# Patient Record
Sex: Male | Born: 1955 | Race: White | Hispanic: No | Marital: Married
Health system: Midwestern US, Academic
[De-identification: ages and names within clinical notes are randomized; demographics above are authoritative.]

---

## 2015-04-01 IMAGING — CR LOW_EXM
3 series · 3 of 3 positions shown · non-contrast
Comparison: none

EXAM: RIGHT KNEE

IMPRESSIONS:
Unremarkable right knee series.
REASON FOR EXAM: Knee pain.  Knee locking up.
TECHNIQUE: Three views right knee obtained 04/01/2015.

[knee ap]
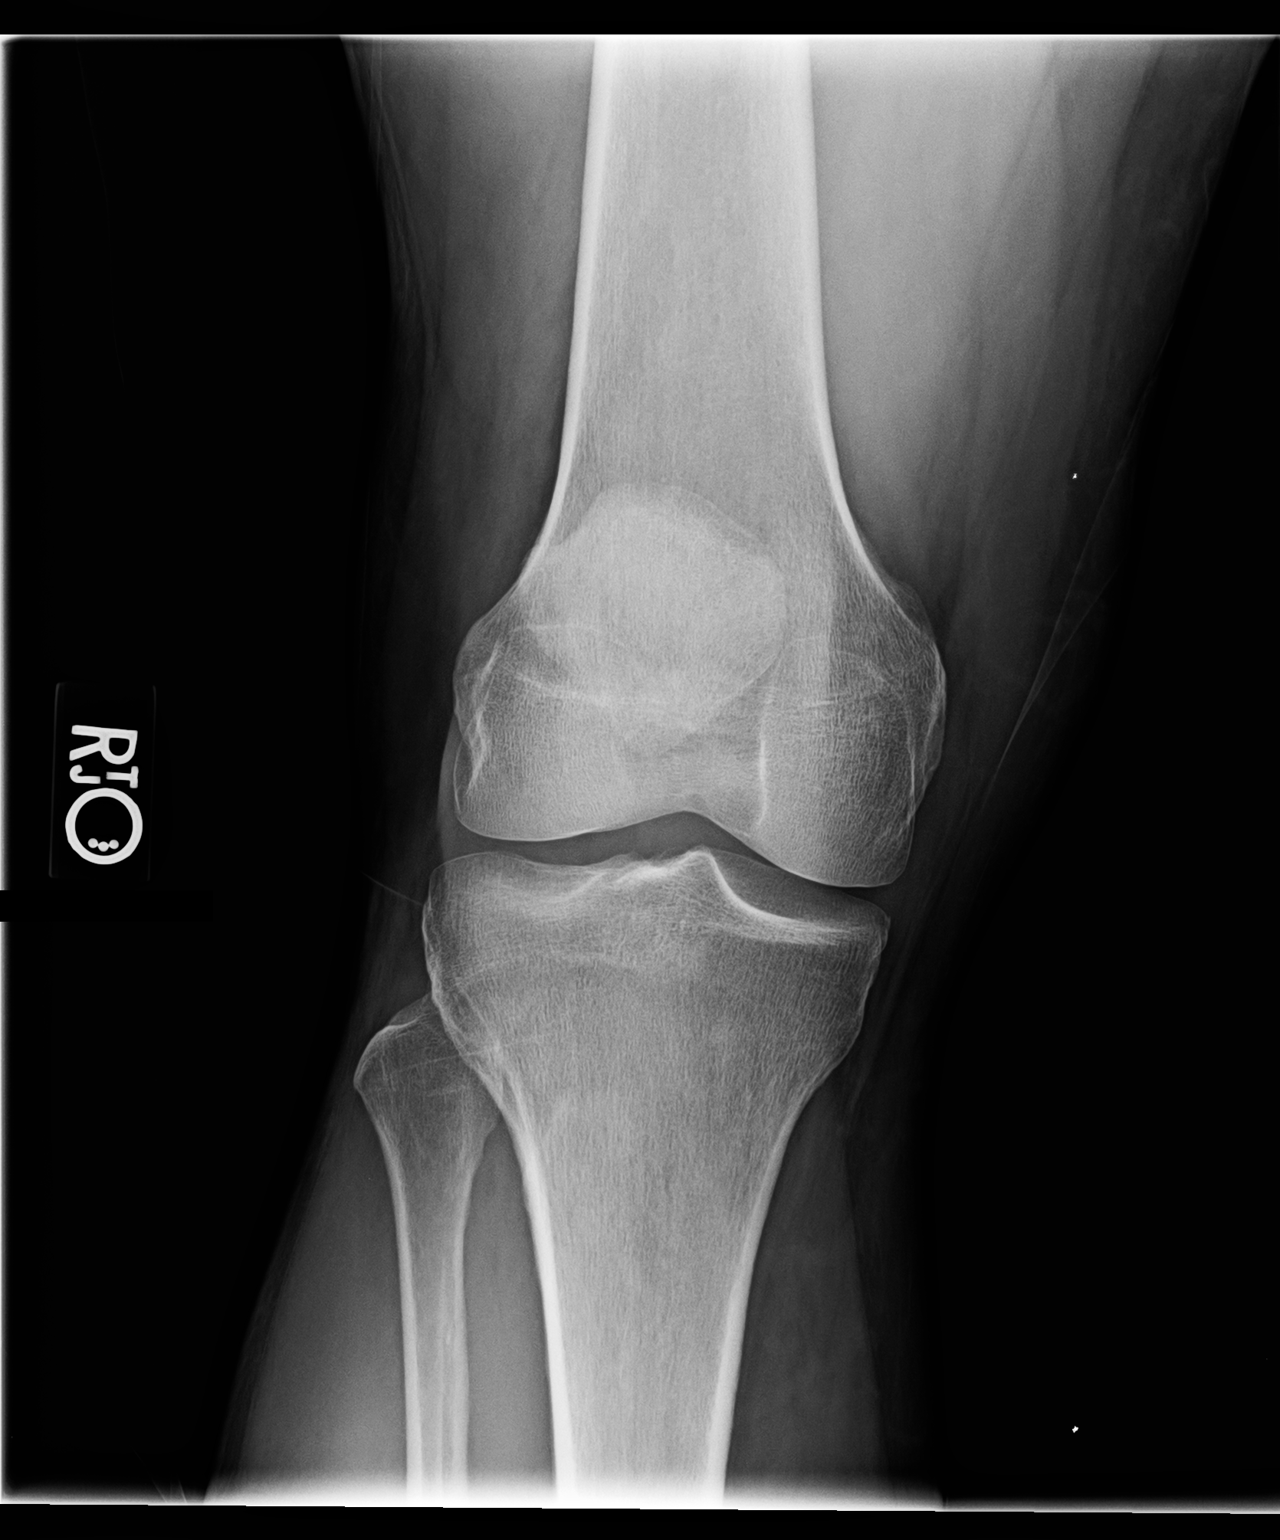

[knee lat]
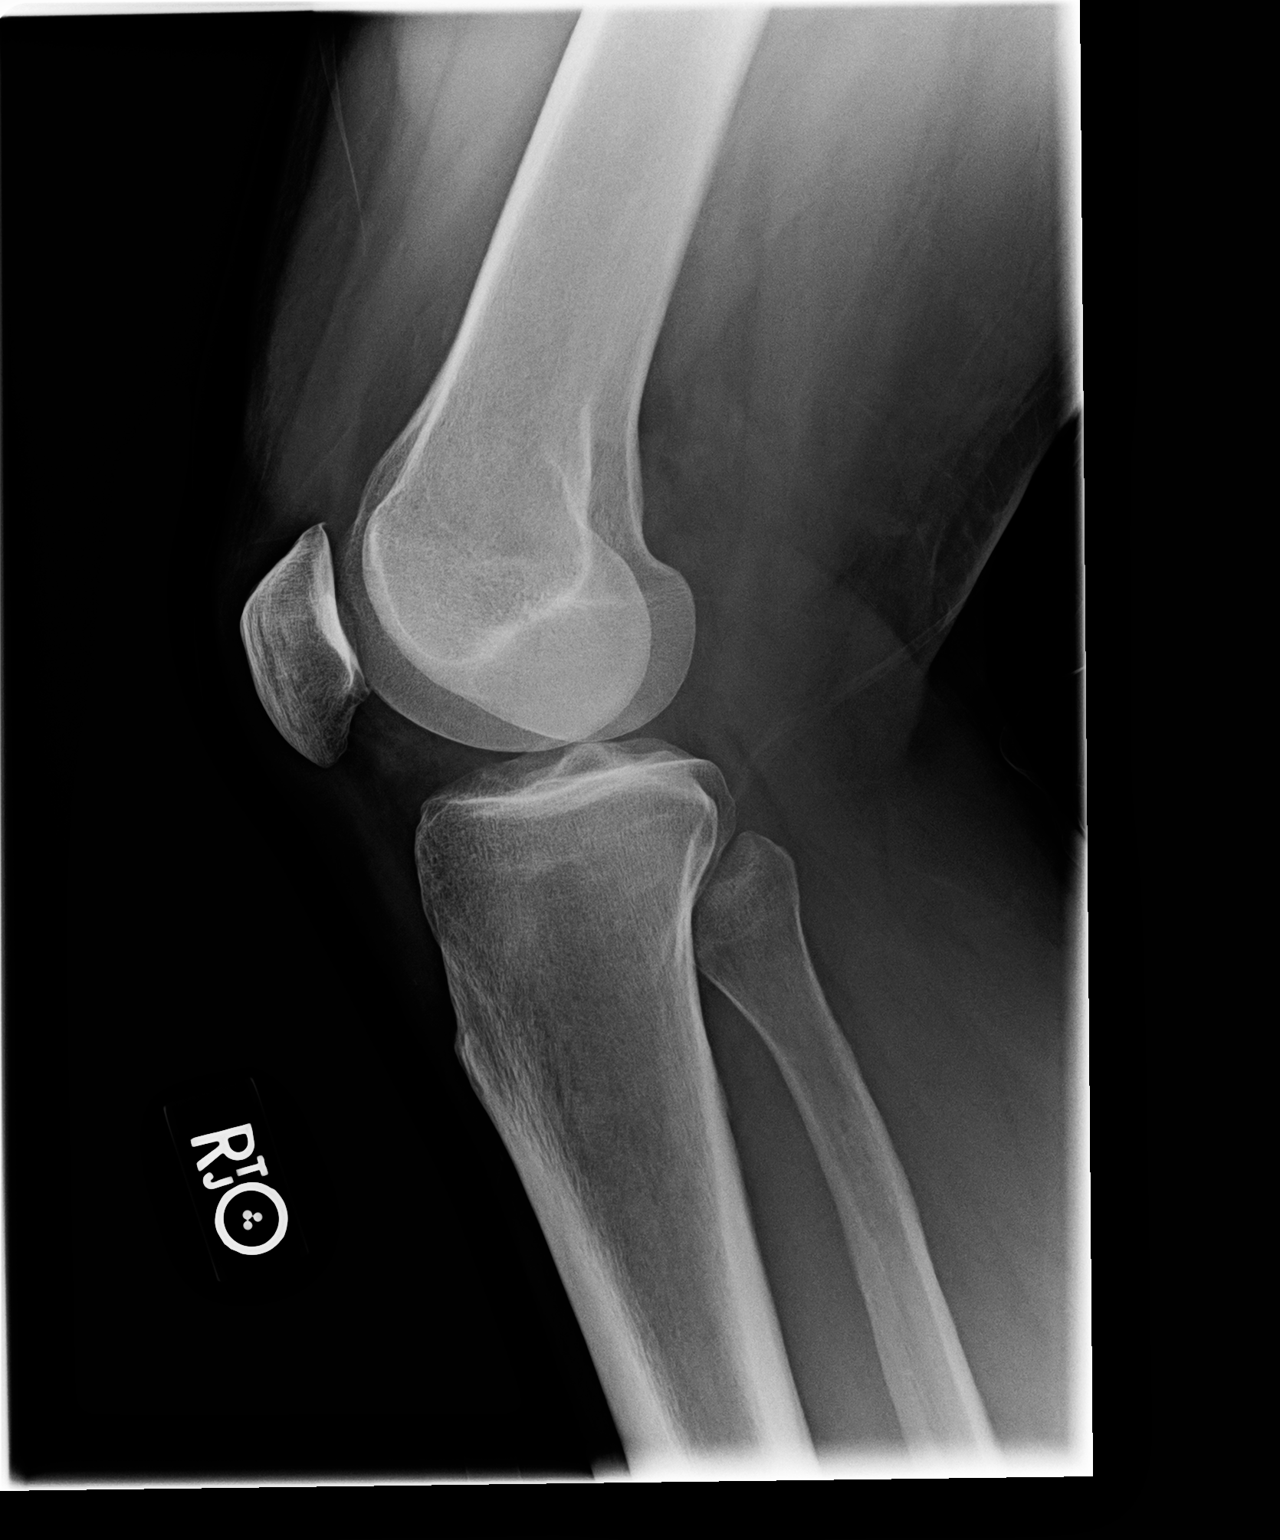

[knee sunrise]
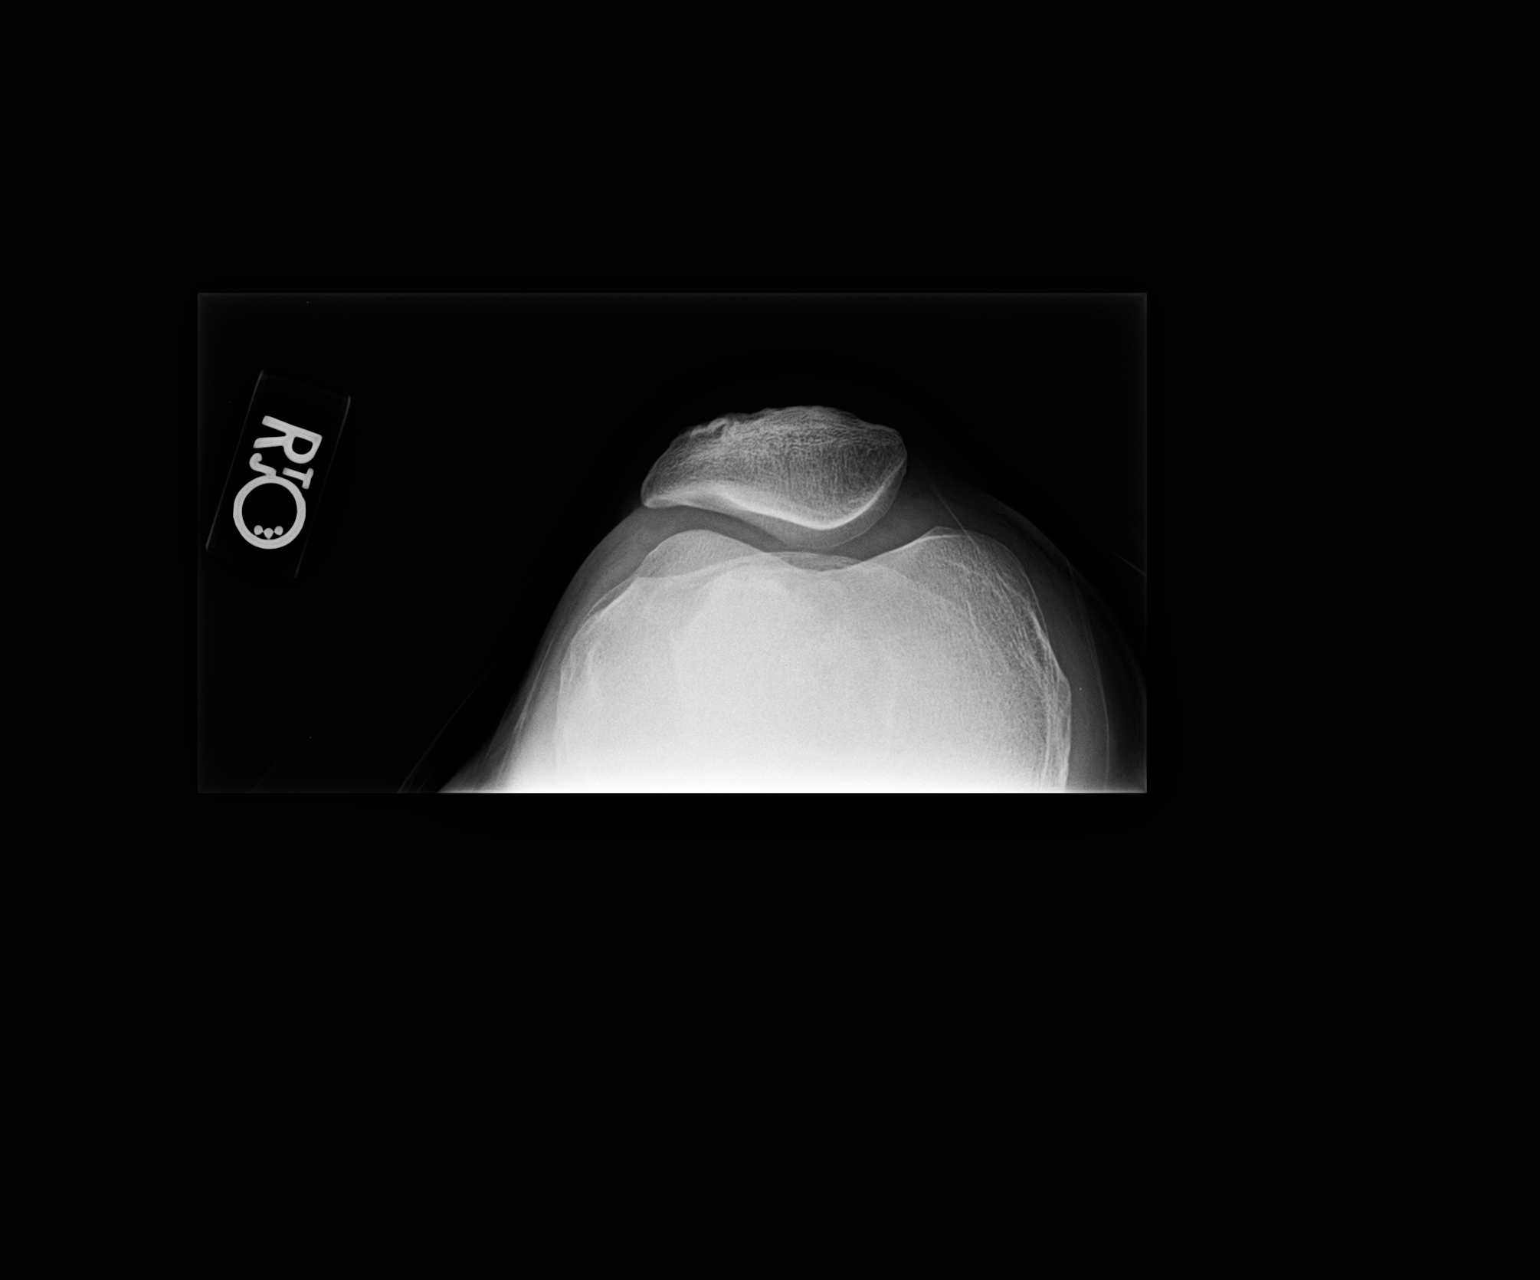

[3 of 3 positions shown; findings below may reference images not displayed]

FINDINGS: Osseous structures are intact.  No fracture.  No dislocation.  No
lytic or blastic change.  Tibial plateaus and femoral condyles are
maintained.  Patella is located within the patella groove.

Tech Notes: RT KNEE LOCKING. C/O ANTERIOR KNEE PAIN AND HIP PAIN. TJ/HB

## 2020-01-25 IMAGING — CR CHEST
4 series · 4 of 4 positions shown · non-contrast
Comparison: none

[shoulder external]
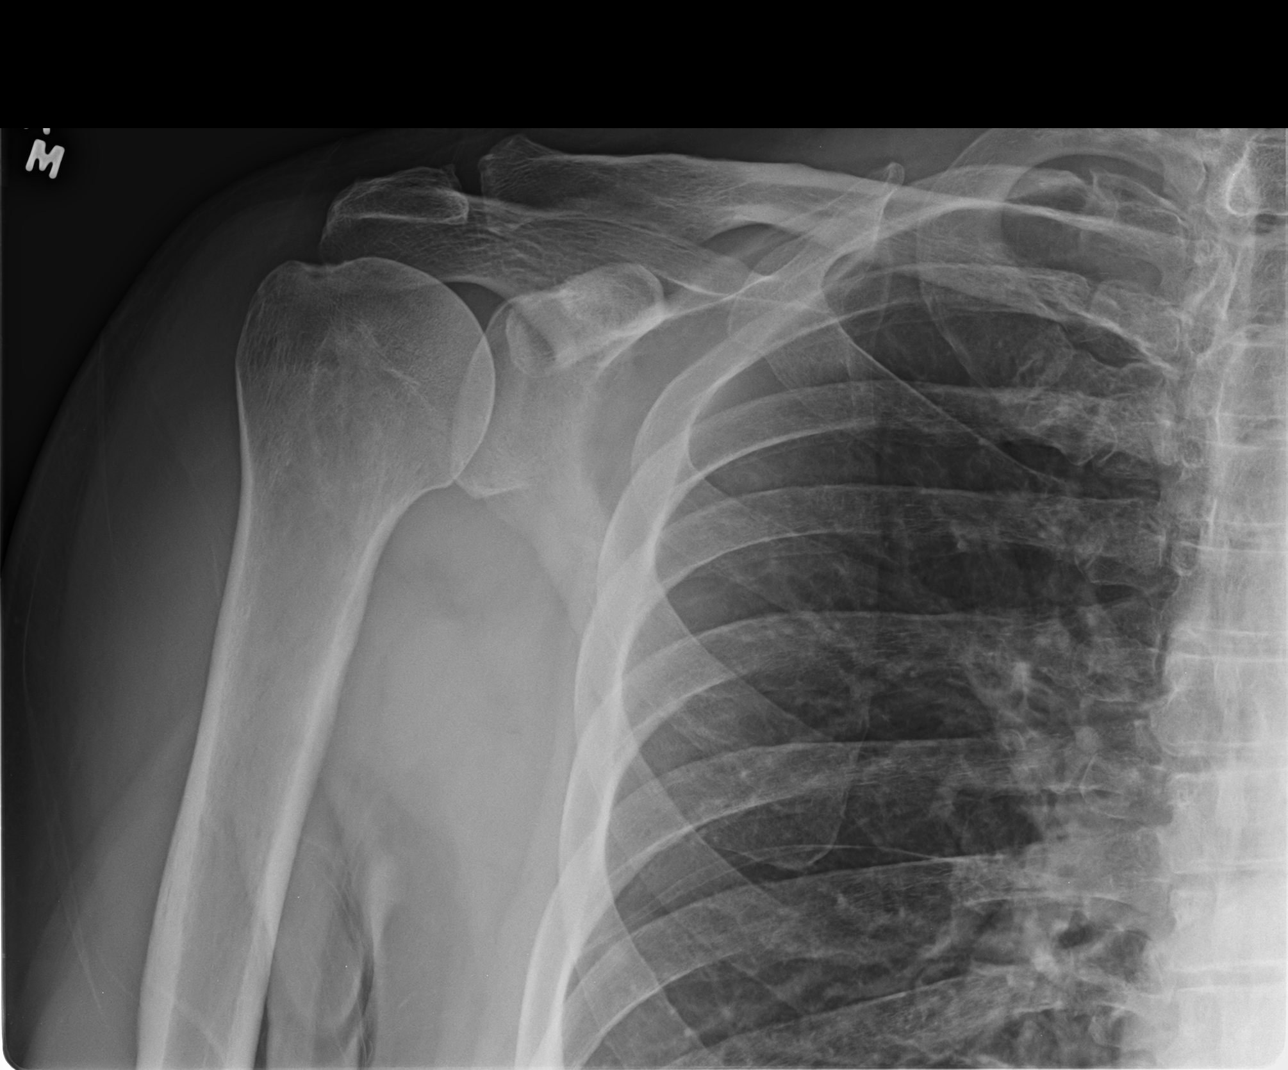

[shoulder internal]
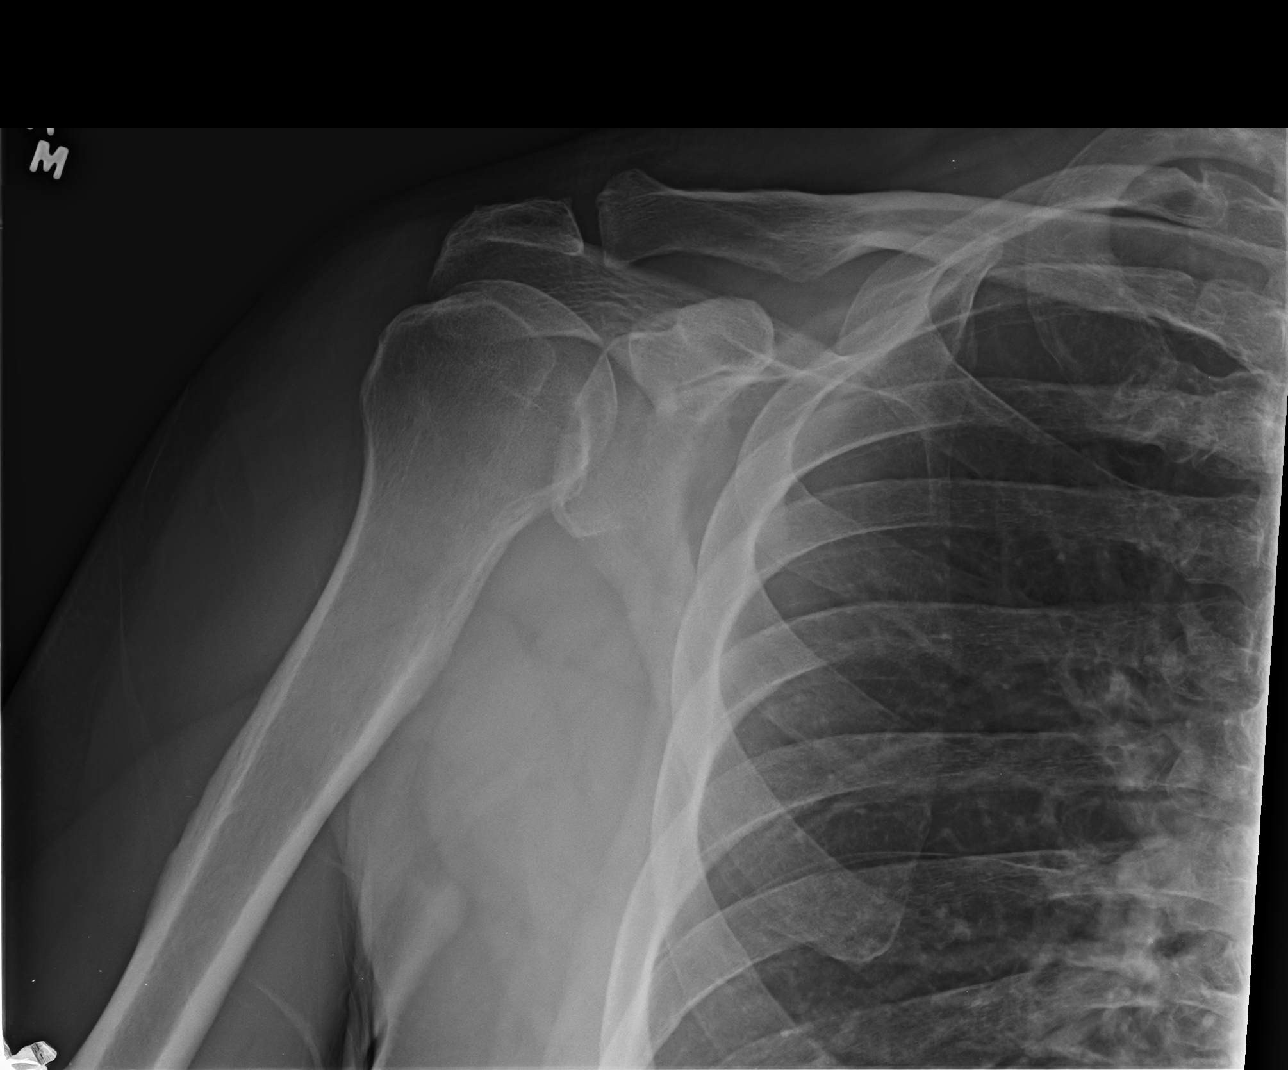

[shoulder y-view]
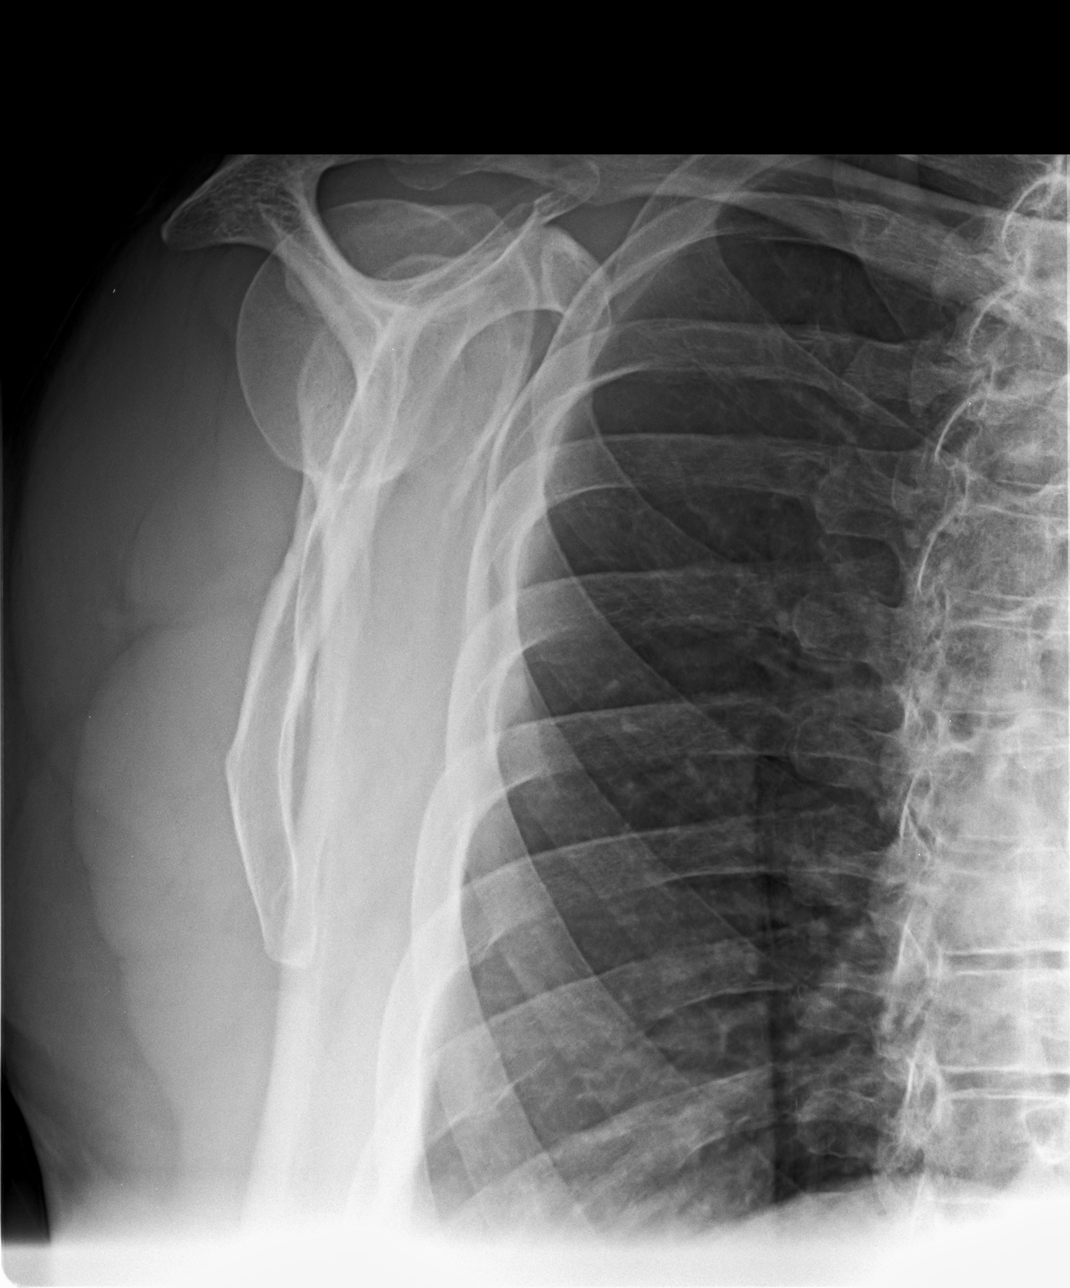

[shoulder axillary]
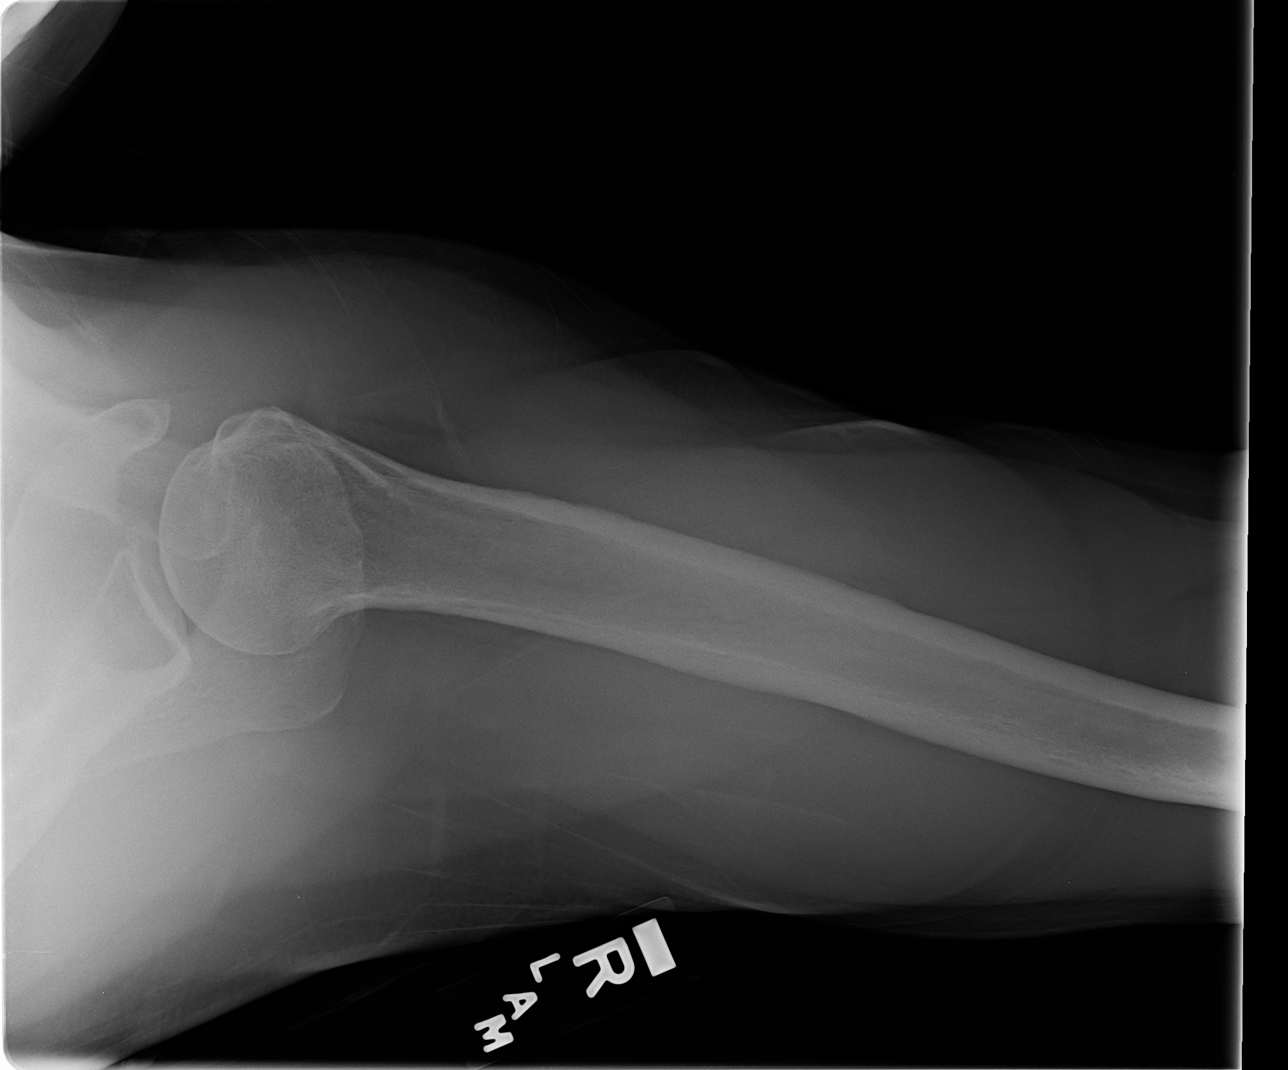

[4 of 4 positions shown; findings below may reference images not displayed]

DIAGNOSTIC STUDIES

EXAM

XR shoulder right, complete

INDICATION

shoulder pain
RT SHOULDER PAIN. LM

TECHNIQUE

Four views of the right shoulder

COMPARISONS

None

FINDINGS

No acute fracture or dislocation. No significant degenerative changes are noted. The soft tissues
are within normal limits. Normal marrow mineralization.

IMPRESSION

No acute osseous findings.

Tech Notes:

RT SHOULDER PAIN. LM

## 2020-01-30 IMAGING — MR Shoulder^ROUTINE
4 of 5 series · 25 of 40 positions shown · non-contrast
Comparison: none

[Series 4: T2 fat-sat · axial · 4.0mm · 0.35mm/px · z∈[-44,+60]mm · 8 of 23 slices shown (1 of 2)]
[im 1/23]
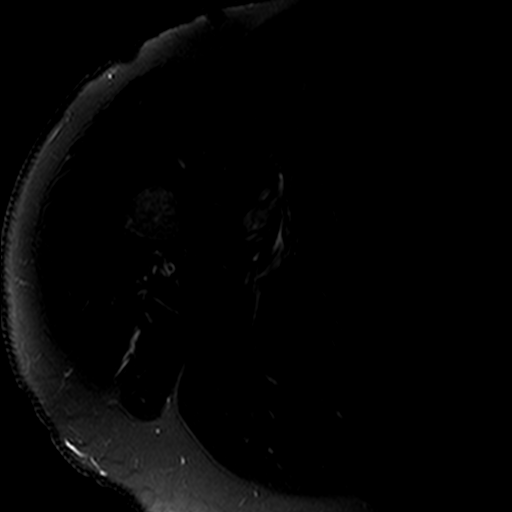
[im 3/23]
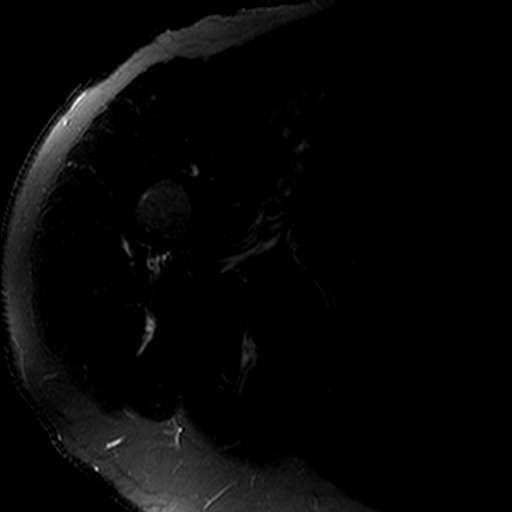
[im 8/23]
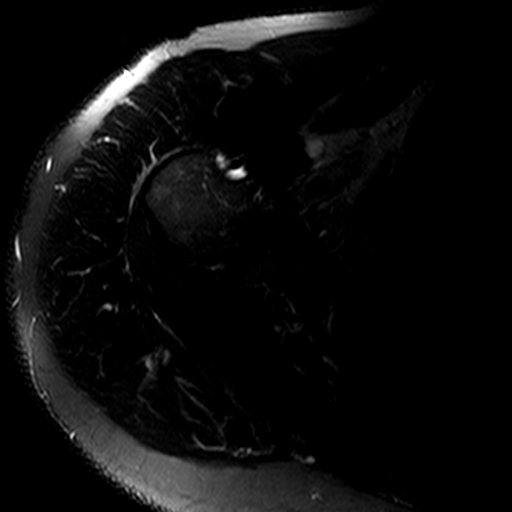
[im 10/23]
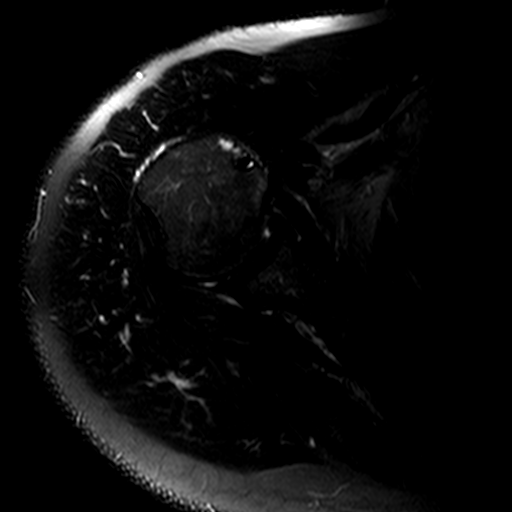
[im 13/23]
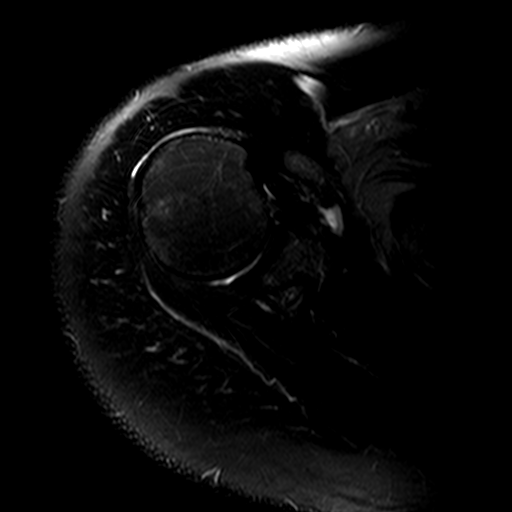
[im 15/23]
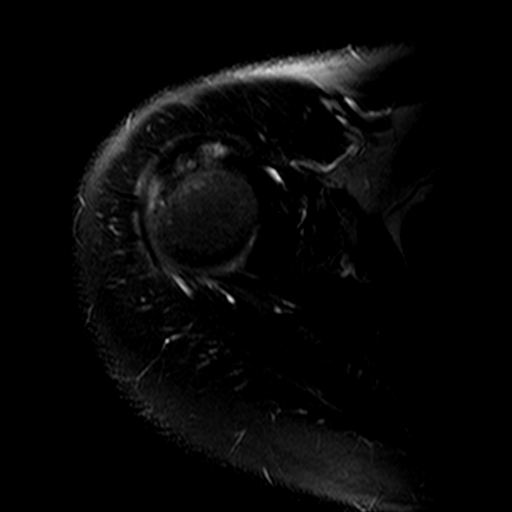
[im 20/23]
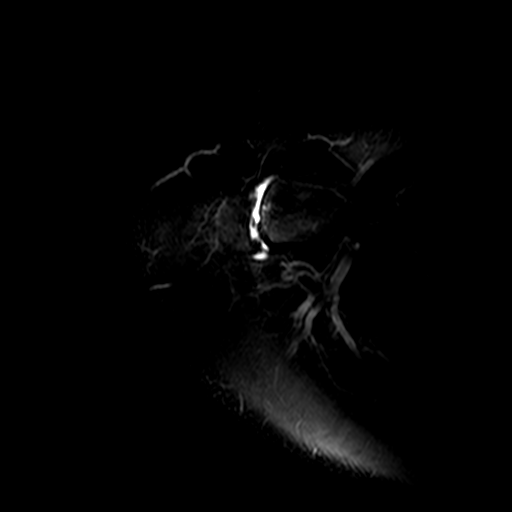
[im 23/23]
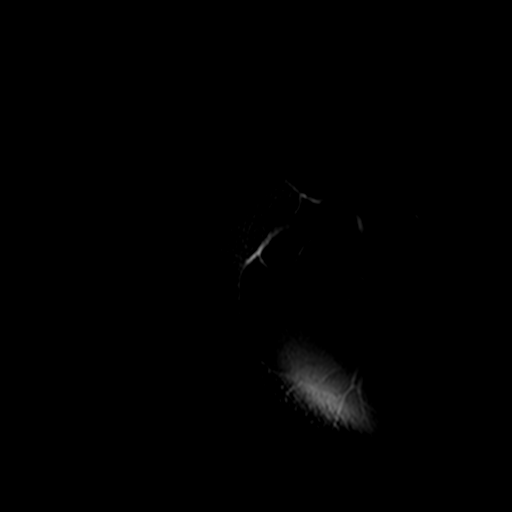

[Series 5: T2 fat-sat · coronal · 4.0mm · 0.27mm/px · 7 of 20 slices shown (2 of 2)]
[im 1/20]
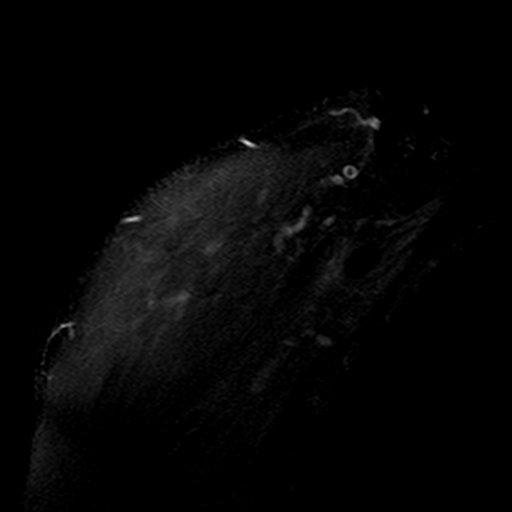
[im 4/20]
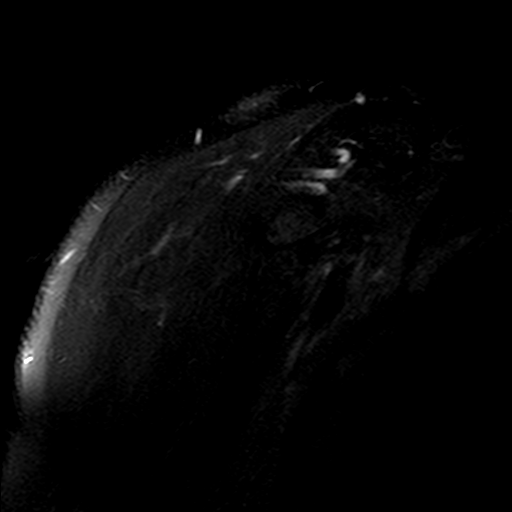
[im 7/20]
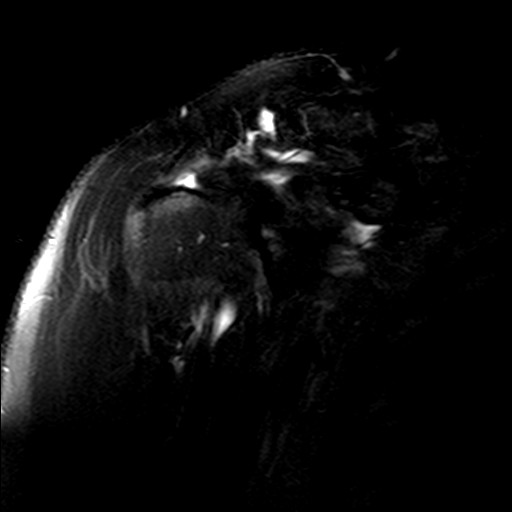
[im 10/20]
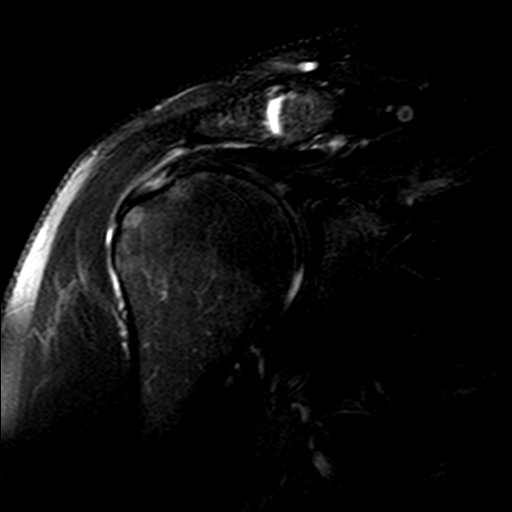
[im 13/20]
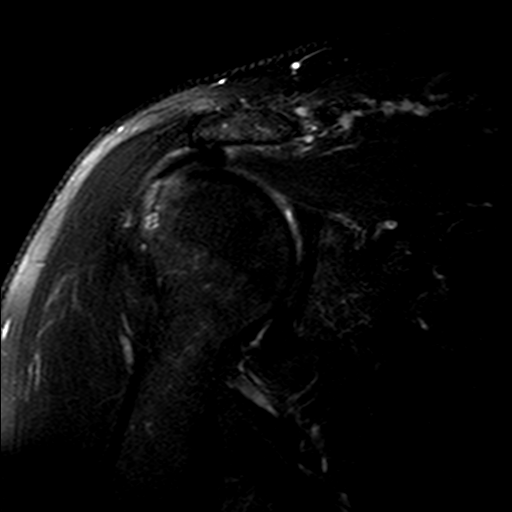
[im 16/20]
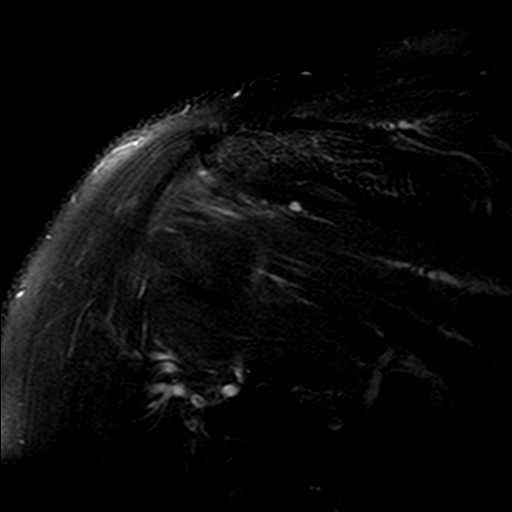
[im 20/20]
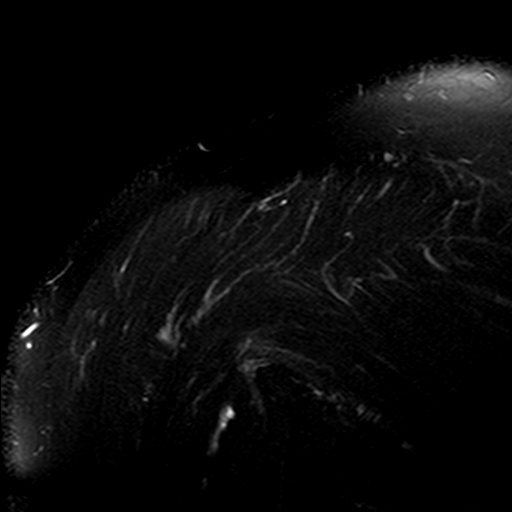

[Series 6: T1 · oblique · 4.0mm · 0.44mm/px · 7 of 22 slices shown (1 of 2)]
[im 1/22]
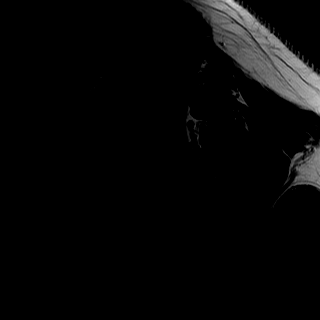
[im 4/22]
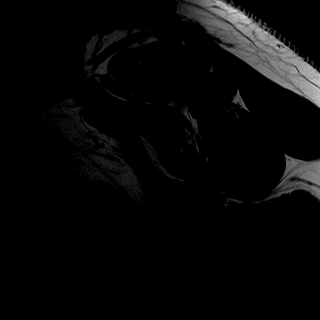
[im 7/22]
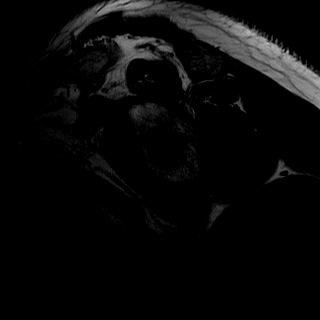
[im 10/22]
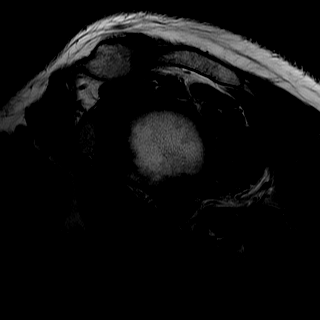
[im 13/22]
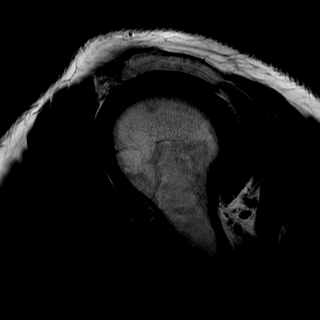
[im 16/22]
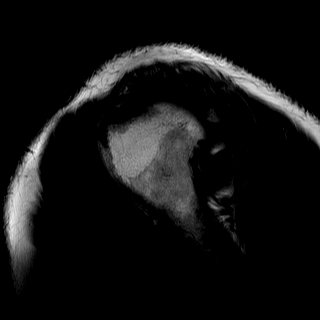
[im 19/22]
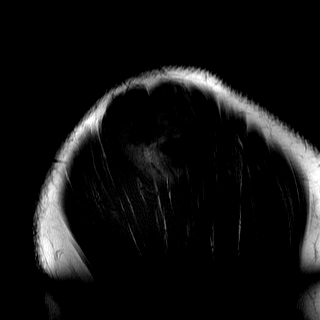

[Series 8: T1 · coronal · 4.0mm · 0.44mm/px · 3 of 20 slices shown (2 of 2)]
[im 4/20]
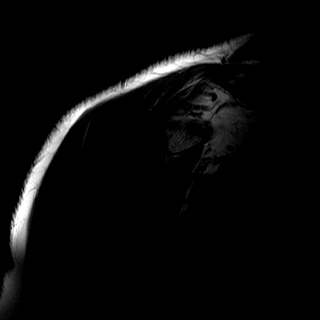
[im 10/20]
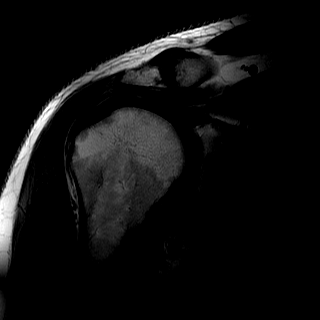
[im 16/20]
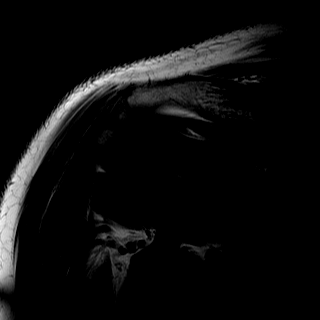

[25 of 40 positions shown; findings below may reference images not displayed]

DIAGNOSTIC STUDIES

EXAM

MRI of the right shoulder without contrast.

INDICATION

shoulder pain
right shoulder pain x1 year, no prev sx on shoulder

TECHNIQUE

Oblique coronal, oblique sagittal, and axial images were obtained with variable T1 and T2
weighting.

COMPARISONS

None available

FINDINGS

Diffuse T2 signal is seen throughout the rotator cuff tendon consistent with
tendinosis/tendinopathy and/or associated intra substance type tear. Small rim rent tear is seen at
the anterior insertion the tendon on the greater tuberosity.

The sub scapularis and bicipital tendons are within normal limits.

Prominent hypertrophic degenerative changes of the AC joint are seen. This slightly deforms the
underlying supraspinatus. The labrum is unremarkable.

The coracoid humeral distance is diminished.

IMPRESSION

Tendinosis/tendinopathy of the rotator cuff tendon with small rim rent tear at the anterior
insertion of the supraspinatus.

Prominent hypertrophic degenerative changes of the AC joint. Symptoms of impingement should be
excluded clinically.

Narrowed coracoid humeral distance. Symptoms of coracoid impingement should be excluded clinically
as well.

Tech Notes:

right shoulder pain x1 year, no prev sx on shoulder

## 2020-05-08 ENCOUNTER — Encounter: Admit: 2020-05-08 | Discharge: 2020-05-08 | Payer: BC Managed Care – PPO

## 2020-05-08 ENCOUNTER — Ambulatory Visit: Admit: 2020-05-08 | Discharge: 2020-05-08 | Payer: BC Managed Care – PPO

## 2020-05-08 DIAGNOSIS — I10 Essential (primary) hypertension: Secondary | ICD-10-CM

## 2020-05-08 DIAGNOSIS — J302 Other seasonal allergic rhinitis: Secondary | ICD-10-CM

## 2020-05-08 DIAGNOSIS — I639 Cerebral infarction, unspecified: Secondary | ICD-10-CM

## 2020-05-08 DIAGNOSIS — H9042 Sensorineural hearing loss, unilateral, left ear, with unrestricted hearing on the contralateral side: Secondary | ICD-10-CM

## 2020-05-08 DIAGNOSIS — H938X2 Other specified disorders of left ear: Secondary | ICD-10-CM

## 2020-05-08 MED ORDER — PREDNISONE 50 MG PO TAB
50 mg | ORAL_TABLET | Freq: Every day | ORAL | 0 refills | Status: AC
Start: 2020-05-08 — End: ?

## 2020-05-08 NOTE — Progress Notes
Date of Service: 05/08/2020    Subjective:             Zebediah Beezley is a 64 y.o. male.    History of Present Illness  Demitri returns after about a 3 to 4-year absence with a history of left ear fullness.  Recall that a tube was placed for this previously which made his symptoms worse.  I did remove it and paper patch it.  He was doing fine for a few years.  In July, he went swimming and diving into a lake and his symptoms of ear fullness on the left side is flared back up again.  It now radiates down to his neck and will occasionally build up to the point where it causes him some occipital headaches.  Today does not bother him so much.  No drainage from the ears noted.  He was given some treatment by his primary care physician for this in the form of eardrops.       Review of Systems   Constitutional: Negative.    HENT: Positive for congestion, ear discharge, ear pain, postnasal drip, rhinorrhea, sinus pressure and sneezing.    Eyes: Positive for itching.   Respiratory: Positive for apnea.    Cardiovascular: Negative.    Gastrointestinal: Negative.    Endocrine: Negative.    Genitourinary: Negative.    Musculoskeletal: Positive for back pain, myalgias and neck stiffness.   Skin: Negative.    Allergic/Immunologic: Negative.    Neurological: Positive for headaches.   Hematological: Negative.    Psychiatric/Behavioral: Negative.          Objective:         ? allopurinoL (ZYLOPRIM) 300 mg tablet Take 300 mg by mouth daily.   ? chlorthalidone (HYGROTON) 25 mg tablet TAKE ONE-HALF TABLET BY MOUTH EVERY DAY   ? fexofenadine (ALLEGRA) 180 mg tablet 1 Tab, Q24H, PO, 0 Number of Refills   ? MEN'S MULTI-VITAMIN PO Take  by mouth.   ? OLMESARTAN MEDOXOMIL (BENICAR PO) Take 40 mg by mouth daily. Indications: 1 tablet   ? pravastatin (PRAVACHOL) 40 mg tablet 10 mg.   ? sodium chloride (SALINE MIST NA) Apply  into nose as directed.   ? triamcinolone (NASACORT) 55 mcg nasal inhaler Apply 2 sprays to each nostril as directed daily.   ? ubidecarenone (CO Q-10 PO) Take 100 mg by mouth.     Vitals:    05/08/20 0913   BP: (!) 159/82   Pulse: 69   Weight: 95.3 kg (210 lb)   Height: 170.2 cm (67)   PainSc: Zero     Body mass index is 32.89 kg/m?Marland Kitchen     Physical Exam    Exam today under microscopy reveals a left sealed and dry eardrum, ear canal looks fine; the same can be said for the right side.  Palpation of the neck reveals no masses or tender trigger points.  Audiogram today is essentially normal on both sides with 100% discrimination.     Assessment and Plan:  Jered returns after about a 3 to 4-year absence with a history of left ear fullness.  Recall that a tube was placed for this previously which made his symptoms worse.  I did remove it and paper patch it.  He was doing fine for a few years.  In July, he went swimming and diving into a lake and his symptoms of ear fullness on the left side is flared back up again.  It now radiates down to  his neck and will occasionally build up to the point where it causes him some occipital headaches.  Today does not bother him so much.  No drainage from the ears noted.  He was given some treatment by his primary care physician for this in the form of eardrops.    Exam today under microscopy reveals a left sealed and dry eardrum, ear canal looks fine; the same can be said for the right side.  Palpation of the neck reveals no masses or tender trigger points.  Audiogram today is essentially normal on both sides with 100% discrimination.    After review his records and everything has been done, I suspect his pain might be neuropathic as opposed to from eustachian tube dysfunction.  I discussed with him possibly getting an MRI and starting him on a medication for neuropathic pain; he is going to message me in my chart to let me know what he thinks about that and tell me how much it bothers him.  In the interim, we will give him a quick burst of prednisone to see if it helps with the either irritated nerve endings or potential eustachian tube dysfunction.

## 2020-11-07 IMAGING — CR SHOULDCMRT
4 series · 4 of 4 positions shown · non-contrast
Comparison: none

[shoulder axillary]
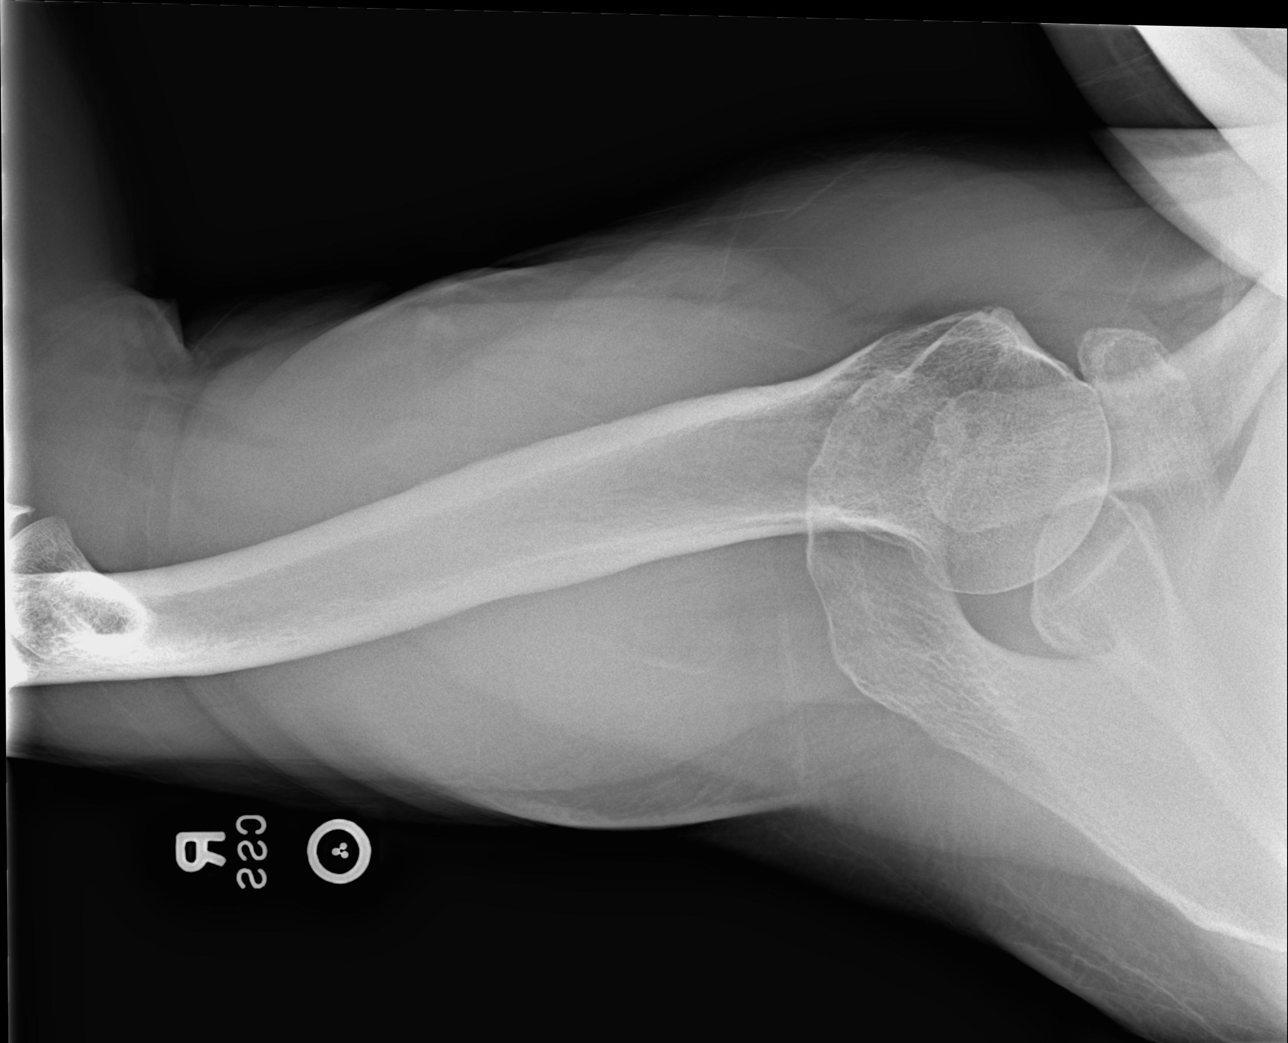

[shoulder internal]
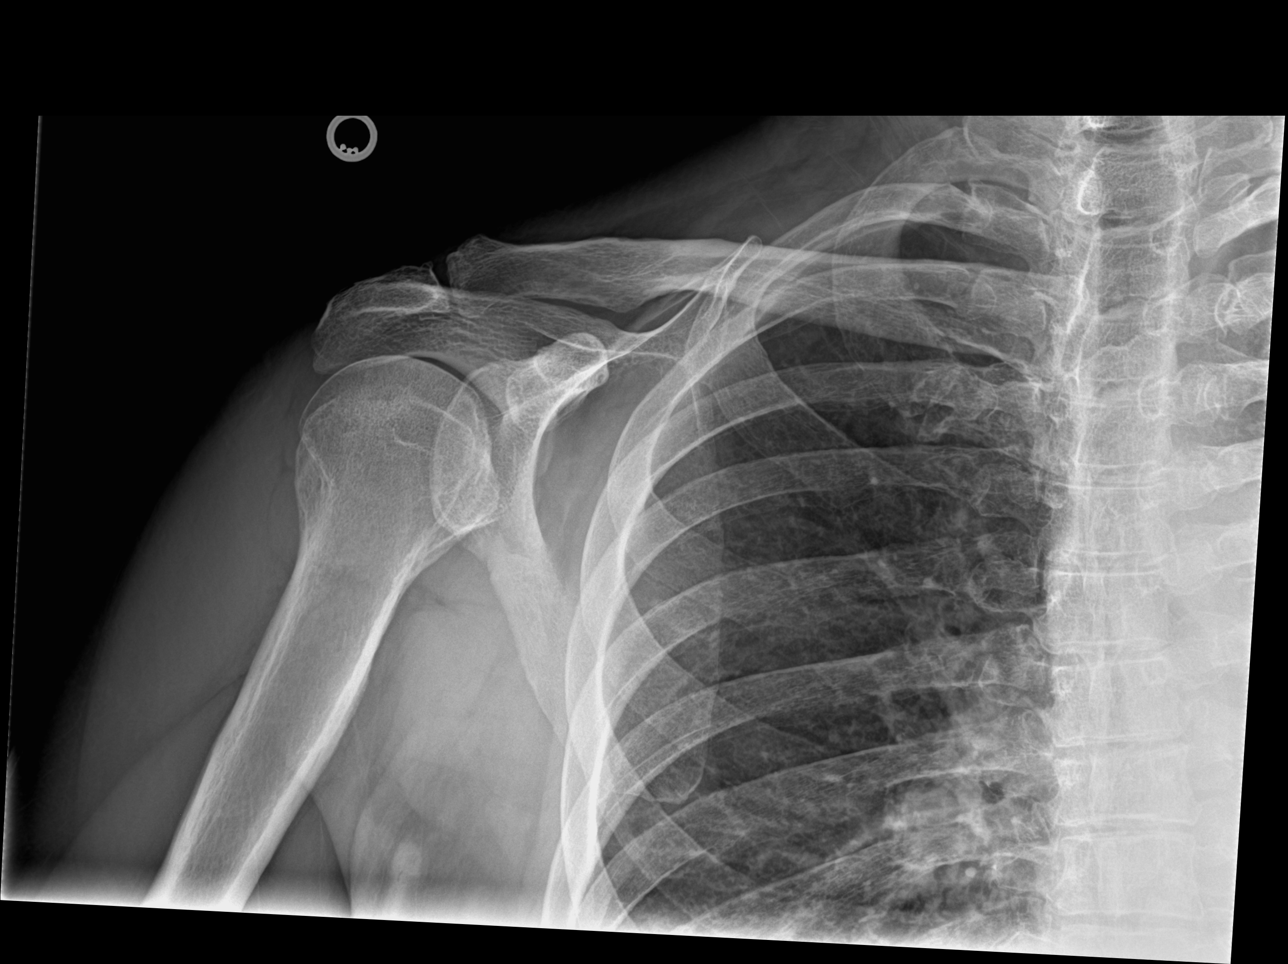

[shoulder external]
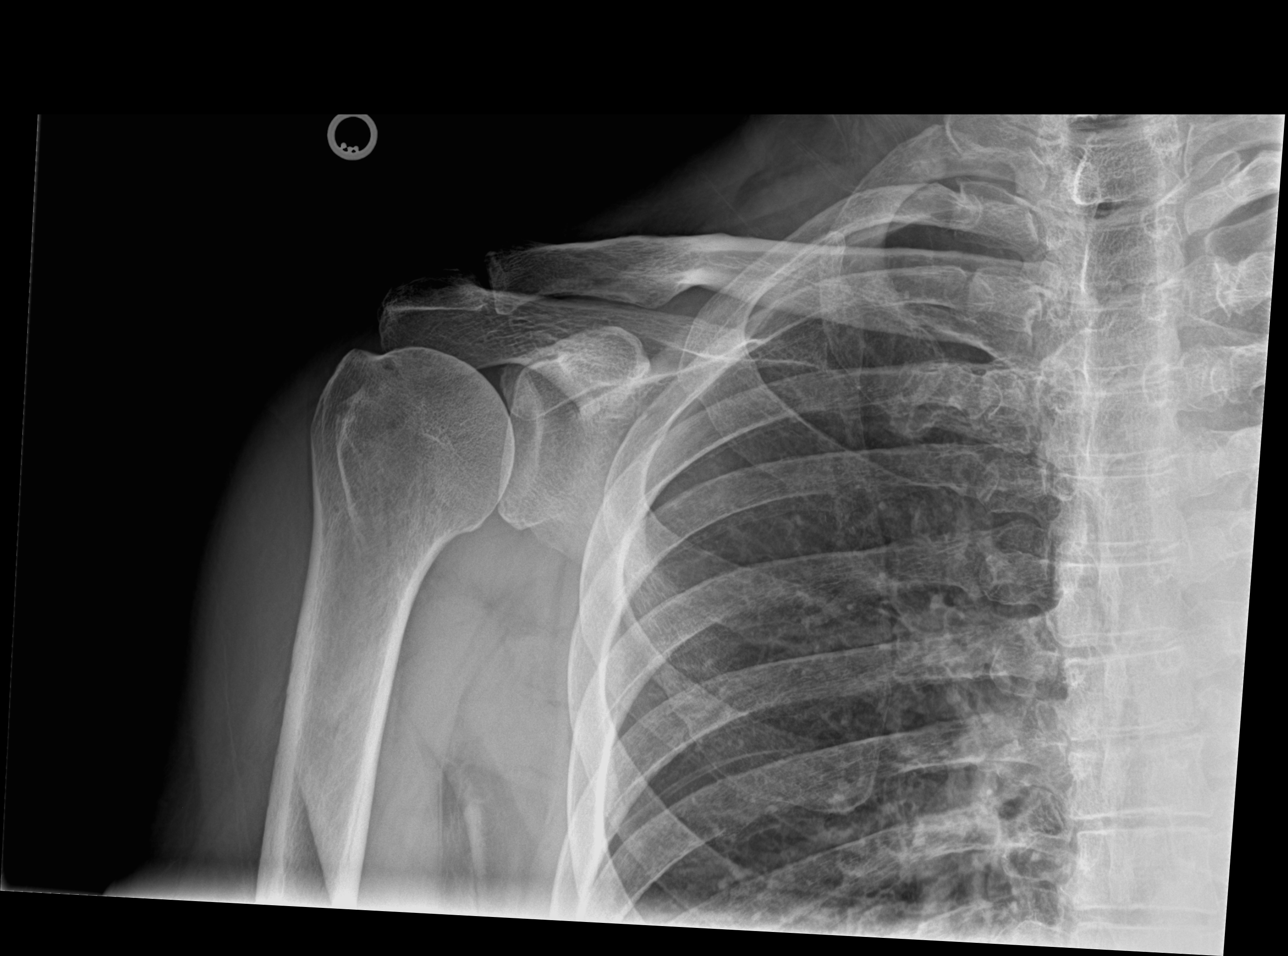

[shoulder y-view]
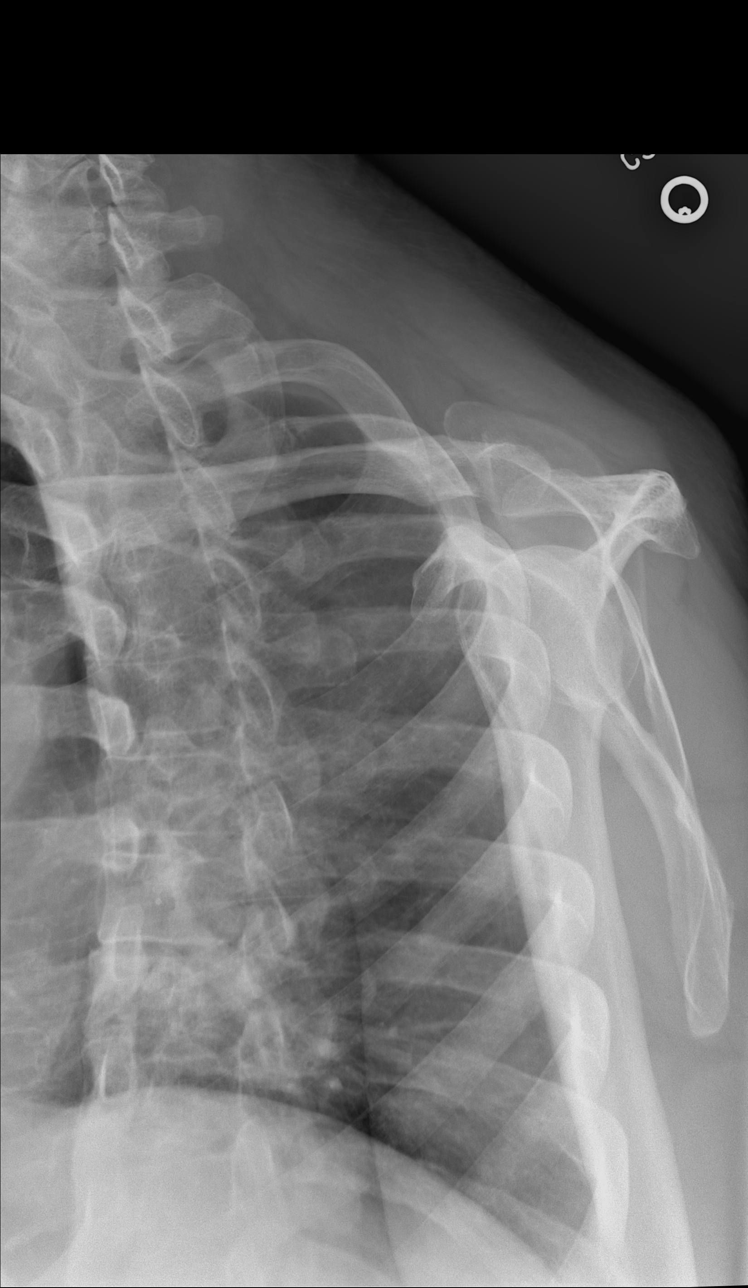

[4 of 4 positions shown; findings below may reference images not displayed]

DIAGNOSTIC STUDIES

EXAM

RIGHT SHOULDER

INDICATION

right shoulder pain
Reinjured Right Shoulder moving some boards on [DATE]. CS

TECHNIQUE

Internal rotation, external rotation, axillary, and Y views of the  right shoulder

COMPARISONS

01/25/2020

FINDINGS

No dislocation or acute bony injury/fracture is identified.  Minimal degenerative changes are
present. No significant changes noted from 01/25/2020 adjacent soft tissue structures are unremarka
ble.

IMPRESSION

No acute bony injury of the  right shoulder.

Tech Notes:

Reinjured Right Shoulder moving some boards on [DATE]. CS

## 2020-12-04 ENCOUNTER — Encounter: Admit: 2020-12-04 | Discharge: 2020-12-04 | Payer: MEDICARE

## 2020-12-27 ENCOUNTER — Encounter: Admit: 2020-12-27 | Discharge: 2020-12-27 | Payer: MEDICARE

## 2020-12-27 NOTE — Progress Notes
65 y.o. male New patient    Reason for Visit: CAS, seen by Neurology in [facility] for TiA and stroke history, referred to [place] because no vascular care in that area. Had CTA head and neck showing 50% R and total occlusion Left.     Any testing available?  Yes    What testing?  CTA Head and Neck-report book marked, imaging has been requested   When was/is testing?  08/21/20   Orders completed AND linked if needed? Yes    Most recent referring providers note available? Yes       Medications updated?  Yes        allopurinoL (ZYLOPRIM) 300 mg tablet Take 300 mg by mouth daily.    chlorthalidone (HYGROTON) 25 mg tablet TAKE ONE-HALF TABLET BY MOUTH EVERY DAY    fexofenadine (ALLEGRA) 180 mg tablet 1 Tab, Q24H, PO, 0 Number of Refills    MEN'S MULTI-VITAMIN PO Take  by mouth.    OLMESARTAN MEDOXOMIL (BENICAR PO) Take 40 mg by mouth daily. Indications: 1 tablet    pravastatin (PRAVACHOL) 40 mg tablet 10 mg.    predniSONE (DELTASONE) 50 mg tablet Take one tablet by mouth daily.    sodium chloride (SALINE MIST NA) Apply  into nose as directed.    triamcinolone (NASACORT) 55 mcg nasal inhaler Apply 2 sprays to each nostril as directed daily.    ubidecarenone (CO Q-10 PO) Take 100 mg by mouth.       History updated?  Yes      has a past medical history of Hypertension, Seasonal allergic reaction, and Stroke (HCC).      has a past surgical history that includes ear tubes (02/25/2016) and colectomy.     Allergies updated? Yes         Allergies   Allergen Reactions    Doxycycline MUSCLE PAIN    Sulfa (Sulfonamide Antibiotics) ITCHING

## 2021-01-01 ENCOUNTER — Encounter: Admit: 2021-01-01 | Discharge: 2021-01-01 | Payer: MEDICARE

## 2021-01-01 ENCOUNTER — Ambulatory Visit: Admit: 2021-01-01 | Discharge: 2021-01-01 | Payer: MEDICARE

## 2021-01-01 DIAGNOSIS — Z8673 Personal history of transient ischemic attack (TIA), and cerebral infarction without residual deficits: Secondary | ICD-10-CM

## 2021-01-01 DIAGNOSIS — I10 Essential (primary) hypertension: Secondary | ICD-10-CM

## 2021-01-01 DIAGNOSIS — E7849 Other hyperlipidemia: Secondary | ICD-10-CM

## 2021-01-01 DIAGNOSIS — I6522 Occlusion and stenosis of left carotid artery: Secondary | ICD-10-CM

## 2021-01-01 DIAGNOSIS — I639 Cerebral infarction, unspecified: Secondary | ICD-10-CM

## 2021-01-01 DIAGNOSIS — J302 Other seasonal allergic rhinitis: Secondary | ICD-10-CM

## 2021-01-01 DIAGNOSIS — I671 Cerebral aneurysm, nonruptured: Secondary | ICD-10-CM

## 2021-01-01 DIAGNOSIS — I6523 Occlusion and stenosis of bilateral carotid arteries: Secondary | ICD-10-CM

## 2021-01-01 NOTE — Progress Notes
Date of Service: 01/01/2021              Chief Complaint   Patient presents with   ? Consult     CAS       History of Present Illness    65 year old gentleman with a past history of stroke in 2014 and again in February 2022.  He has a history of hypertension and hyperlipidemia.  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  His most recent stroke on July 09, 2020 occurred in [place].  He reports that he had speech problems and right hand grasping problems with dropping items unexpectedly.  By the time he was evaluated at the hospital, too much time had elapsed for him to be able to have tPA.  Fortunately, he has had no significant residual deficits.  His prior stroke in 2014 was more obvious.  He reports his right arm went completely limp, he went immediately to the emergency room and did receive tPA.  At the time of his stroke in 2014, he had less than 50% stenosis bilaterally in his internal carotid arteries.  When he had his stroke this past February, he was found to have a completely occluded left internal carotid artery.    He had a bilateral carotid surveillance ultrasound today which showed normal velocities on the right with a ratio of 1.2 and a chronically occluded left internal carotid artery.  He reports no lateralizing TIA or strokelike symptoms since his stroke in February.  He denies chest pain, tightness and shortness of breath.  He denies fever and chills.  He denies symptoms of claudication when he walks.    He had a CTA of the head and neck  on August 21, 2020 which confirmed the occluded left internal carotid artery at the origin with retrograde flow into the distal ICA and with patent flow into the left ophthalmic artery.  He also had a minimal 2.5 mm aneurysm along the right MCA M1/M2 junction of the proximal M2 inferior division branch.    He has no history of coronary artery intervention, has never had a heart attack, coronary artery bypass grafting, pacemaker or defibrillator implantation.    He has no history of radiation or chemotherapy for any reason.    He is not diabetic.  He has no history of renal insufficiency.  He does not smoke.    He is on aspirin and rosuvastatin daily.  The rosuvastatin was started after his most recent stroke.        Medical History:   Diagnosis Date   ? Hypertension    ? Seasonal allergic reaction    ? Stroke [facility])        Surgical History:   Procedure Laterality Date   ? HX EAR TUBES  02/25/2016   ? COLECTOMY         Allergies:  Allergies   Allergen Reactions   ? Doxycycline MUSCLE PAIN   ? Sulfa (Sulfonamide Antibiotics) ITCHING       Medication List:  ? allopurinoL (ZYLOPRIM) 300 mg tablet Take 300 mg by mouth daily.   ? aspirin EC 81 mg tablet Take 81 mg by mouth daily. Take with food.   ? chlorthalidone (HYGROTON) 25 mg tablet TAKE ONE-HALF TABLET BY MOUTH EVERY DAY   ? fexofenadine (ALLEGRA) 180 mg tablet 1 Tab, Q24H, PO, 0 Number of Refills   ? MEN'S MULTI-VITAMIN PO Take  by mouth.   ? OLMESARTAN MEDOXOMIL (BENICAR PO) Take 40 mg by mouth daily. Indications: 1 tablet   ? rosuvastatin (CRESTOR) 20 mg tablet Take 20 mg by mouth daily.   ? sodium chloride (SALINE MIST NA) Apply  into nose as directed.   ? triamcinolone (NASACORT) 55 mcg nasal inhaler Apply 2 sprays to each nostril as directed daily.   ? ubidecarenone (CO Q-10 PO) Take 100 mg by mouth.       Social History:   reports that he has never smoked. He has never used smokeless tobacco. He reports current alcohol use. He reports that he does not use drugs.    History reviewed. No pertinent family history.    Review of Systems   Constitutional: Negative.    HENT: Negative.    Eyes: Negative.    Respiratory: Positive for apnea.    Cardiovascular: Negative.    Gastrointestinal: Negative.    Endocrine: Positive for cold intolerance, polydipsia and polyuria.   Genitourinary: Negative.    Musculoskeletal: Positive for arthralgias, back pain, joint swelling, myalgias, neck pain and neck stiffness. Negative for gait problem.   Skin: Negative.    Allergic/Immunologic: Positive for environmental allergies and food allergies.   Neurological: Positive for weakness and numbness.   Hematological: Negative.    Psychiatric/Behavioral: Positive for sleep disturbance.               Vitals:    01/01/21 1243 01/01/21 1311   BP: (!) 148/64 (!) 150/71   BP Source: Arm, Left Upper Arm, Right Upper   Pulse: 61 61   Temp: 36.6 ?C (97.9 ?F)    Resp: 14    TempSrc: Oral    PainSc: Zero    Weight: 93.6 kg (206 lb 6.4 oz)    Height: 170.2 cm (5' 7)      Body mass index is 32.33 kg/m?Marland Kitchen     Physical Exam  Vitals and nursing note reviewed. Exam conducted with a chaperone present.   Constitutional:       General: He is not in acute distress.     Appearance: Normal appearance. He is well-developed, well-groomed and overweight. He is not ill-appearing or diaphoretic.   HENT:      Head: Normocephalic.   Eyes:      General:         Right eye: No discharge.         Left eye: No discharge.   Neck:      Vascular: No carotid bruit.      Trachea: Trachea and phonation normal. No tracheal deviation.   Cardiovascular:      Rate and Rhythm: Normal rate and regular rhythm.      Pulses:           Carotid pulses are 2+ on the right side and 2+ on the left side.       Radial pulses are 2+ on the right side and 2+ on the left side.        Femoral pulses are 2+ on the right side and 2+ on the left side.       Popliteal pulses are 2+ on the right side and 2+ on the left side.        Dorsalis pedis pulses are 2+ on the right side and 2+ on the left side.        Posterior tibial pulses are 2+ on the right side and 2+ on the left side.  Heart sounds: Normal heart sounds.   Pulmonary:      Effort: Pulmonary effort is normal. No respiratory distress.      Breath sounds: Normal breath sounds.   Abdominal:      General: Abdomen is protuberant. A surgical scar is present.      Palpations: Abdomen is soft.      Tenderness: There is no abdominal tenderness.      Comments: His bilateral groins are clean and dry without evidence of rash or intervention.    He has a well-healed midline abdominal scar from prior colon resection for diverticulitis and then surgery for an abdominal incisional hernia that developed after.   Musculoskeletal:         General: Normal range of motion.      Cervical back: Normal range of motion and neck supple.      Right lower leg: No edema.      Left lower leg: No edema.      Right foot: Normal range of motion. No deformity.      Left foot: Normal range of motion. No deformity.   Feet:      Right foot:      Skin integrity: Skin integrity normal. No ulcer, blister, skin breakdown or erythema.      Toenail Condition: Right toenails are normal.      Left foot:      Skin integrity: Skin integrity normal. No ulcer, blister, skin breakdown or erythema.      Toenail Condition: Left toenails are normal.   Skin:     General: Skin is warm and dry.      Capillary Refill: Capillary refill takes less than 2 seconds.      Coloration: Skin is not cyanotic or mottled.   Neurological:      General: No focal deficit present.      Mental Status: He is alert and oriented to person, place, and time.      Motor: Motor function is intact.      Coordination: Coordination is intact.      Gait: Gait is intact.      Comments: Grip strength is strong and equal bilaterally.   Psychiatric:         Attention and Perception: Attention normal.         Mood and Affect: Mood normal.         Speech: Speech normal.         Behavior: Behavior normal. Behavior is cooperative.         Thought Content: Thought content normal.         Cognition and Memory: Cognition and memory normal.         Judgment: Judgment normal.             Assessment and Plan:    1. Bilateral carotid artery stenosis  VAS US DUPLEX SCAN CAROTID BILATERAL   2. History of stroke     3. 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 Essential hypertension     6. Other hyperlipidemia       He has bilateral carotid stenosis of less than 50% on the right and a chronically occluded left internal carotid artery.  Since his stroke in February 2022.  When he had clumsiness of his right arm, he has had no additional lateralizing TIA or strokelike symptoms.  I discussed with him his results and recommendations for follow-up.  We discussed specifically the atherosclerotic risk factor modification necessary for management of carotid stenosis.  We discussed good blood pressure control and good cholesterol control along with antiplatelet therapy to help reduce risk factors.  We discussed that genetics plays a greater role in the development of aneurysms.  We discussed that high blood pressure and tobacco abuse affect the growth of aneurysms.  He is not smoking and he is not diabetic.  We discussed that the small middle cerebral artery distribution aneurysm that he has on the right-hand side is likely an incidental finding and I will check with neuro interventional radiology to see what kind of follow-up he will need.    He should continue his aspirin and rosuvastatin daily.    He is anticipating eventual right shoulder surgery.  There is no contraindication from a vascular surgery standpoint for shoulder surgery.  If necessary, he may hold his aspirin long enough to get safely through the procedure.  He should then resume it as soon as reasonably possible after the surgery.    We will follow-up with a carotid surveillance ultrasound and office visit in 1 year.    We will follow-up by phone or by MyChart with recommendations regarding his small middle cerebral artery distribution aneurysm.    We gave him information on TIA and stroke for his visit summary.  He had no additional questions or concerns for me at this time.    Kathaleen Maser, APRN-NP

## 2021-01-02 ENCOUNTER — Encounter: Admit: 2021-01-02 | Discharge: 2021-01-02 | Payer: MEDICARE

## 2021-01-02 DIAGNOSIS — M25511 Pain in right shoulder: Secondary | ICD-10-CM

## 2021-01-05 ENCOUNTER — Encounter: Admit: 2021-01-05 | Discharge: 2021-01-05 | Payer: MEDICARE

## 2021-01-05 DIAGNOSIS — R69 Illness, unspecified: Secondary | ICD-10-CM

## 2021-01-06 ENCOUNTER — Encounter: Admit: 2021-01-06 | Discharge: 2021-01-06 | Payer: MEDICARE

## 2021-01-06 DIAGNOSIS — I671 Cerebral aneurysm, nonruptured: Secondary | ICD-10-CM

## 2021-01-06 NOTE — Telephone Encounter
Madarang, Kyung Rudd, MD  Kathaleen Maser, APRN-NP; Vonna Kotyk, MD; Bishop Limbo, RN; Polo Riley, RN  Denise, Perfect, would be happy to follow along with you. ?Please put in consult order attn: to me Dr Bess Kinds and we will reach out to patient to arrange a consult and discuss with him findings and our recommendations.     John      ?  ----- Message -----   From: Kathaleen Maser, APRN-NP   Sent: 01/06/2021 ? 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 ? ?     Thank you so much [name]. ?Yes, I think at the patient's and your convenience, he should at least meet with you so that you can manage with the appropriate follow-up. ?Our follow-up would consist of carotid duplex scanning.     Thank you so much for your input!     Regards,     Kathaleen Maser, APRN-NP     From: Jasmine Pang, MD   Sent: 01/06/2021 ? 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 ? ?     Angelique Blonder and [place],     Sorry for the delay, I was out of the office all last week and just getting caught up. I think yearly surveillance of both his carotid stenosis and mca aneurysm would be just fine. A 2.5 mm aneurysm is quite small and we typically only offer surveillance of something like this. The risk of rupture is very small approximating 1% or less in next 5 years. One thing to consider is tight blood pressure control if he is hypertensive, as this might lead to a change in the aneurysm if HTN is long standing. If the patient is overly anxious about these findings, I would be happy to see in consult regarding both findings and follow along with you. If so, let me know and we can reach out to the patient.     John           ----- Message -----   From: Vonna Kotyk, MD   Sent: 01/02/2021 ? 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 ? ?       Hi Denise, I am forwarding this on to [name], one of our Neurointerventionalists to get his opinion on management of the aneurysm.     Apolinar Junes     ----- Message -----   From: Kathaleen Maser, APRN-NP   Sent: 01/01/2021 ? 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 ? ? ? ?     Hi Dr. Felipa Furnace and /or Dr. Ree Kida,     I saw this gentleman today to establish care for his chronically occluded left internal carotid artery and his less than 50% right internal carotid stenosis. ?He had a CTA of the head and neck in April 2022 in [place] which confirmed those findings. ?     He also had a finding of a minimal 2.5 mm aneurysm along the right MCA M1/M2 junction of the proximal M2 inferior division branch.     I have no  idea if this is of any significance at all. ?To whom should I refer him if he should have regular monitoring of this and when would this be large enough to consider attempting some type of coiling or repair?     Please let me know your thoughts. ?I would greatly appreciate it!     Regards,     Kathaleen Maser, APRN-NP        Received the above staff message. Order for referral placed.

## 2021-01-07 ENCOUNTER — Encounter: Admit: 2021-01-07 | Discharge: 2021-01-07 | Payer: MEDICARE

## 2021-01-07 DIAGNOSIS — R69 Illness, unspecified: Secondary | ICD-10-CM

## 2021-01-07 DIAGNOSIS — I729 Aneurysm of unspecified site: Secondary | ICD-10-CM

## 2021-01-07 NOTE — Patient Education
Dear Iantha Fallen,     Thank you for choosing [facility]. Your appointment information is listed below:     You are scheduled for a Consult with [name] on 01/24/21 at 11:00 AM.   Please check into admission of the [facility] [employer]: [address], [place], [place]  [address]  Parking: [address]       If you have questions about your procedure or need to reschedule please call [phone].

## 2021-01-07 NOTE — Progress Notes
Request for medical records faxed to [facility] at [phone]

## 2021-01-08 ENCOUNTER — Encounter: Admit: 2021-01-08 | Discharge: 2021-01-08 | Payer: MEDICARE

## 2021-01-08 ENCOUNTER — Ambulatory Visit: Admit: 2021-01-08 | Discharge: 2021-01-08 | Payer: MEDICARE

## 2021-01-08 DIAGNOSIS — I10 Essential (primary) hypertension: Secondary | ICD-10-CM

## 2021-01-08 DIAGNOSIS — J302 Other seasonal allergic rhinitis: Secondary | ICD-10-CM

## 2021-01-08 DIAGNOSIS — M25511 Pain in right shoulder: Secondary | ICD-10-CM

## 2021-01-08 DIAGNOSIS — I639 Cerebral infarction, unspecified: Secondary | ICD-10-CM

## 2021-01-09 ENCOUNTER — Encounter: Admit: 2021-01-09 | Discharge: 2021-01-09 | Payer: MEDICARE

## 2021-01-09 ENCOUNTER — Ambulatory Visit: Admit: 2021-01-09 | Discharge: 2021-01-09 | Payer: MEDICARE

## 2021-01-09 DIAGNOSIS — M7521 Bicipital tendinitis, right shoulder: Secondary | ICD-10-CM

## 2021-01-09 DIAGNOSIS — M19011 Primary osteoarthritis, right shoulder: Secondary | ICD-10-CM

## 2021-01-09 DIAGNOSIS — M75101 Unspecified rotator cuff tear or rupture of right shoulder, not specified as traumatic: Secondary | ICD-10-CM

## 2021-01-09 MED ORDER — CEFAZOLIN 1 GRAM IJ SOLR
2 g | Freq: Once | INTRAVENOUS | 0 refills
Start: 2021-01-09 — End: ?

## 2021-01-23 IMAGING — MR L-spine^Routine
5 series · 34 of 48 positions shown · non-contrast
Comparison: none

[Series 2: T2 · sagittal · 4.0mm · 0.62mm/px · 6 of 13 slices shown (1 of 2)]
[im 1/13]
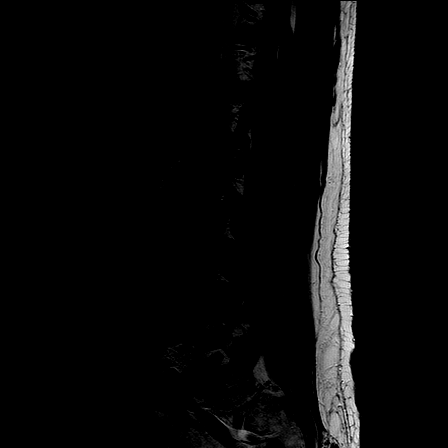
[im 3/13]
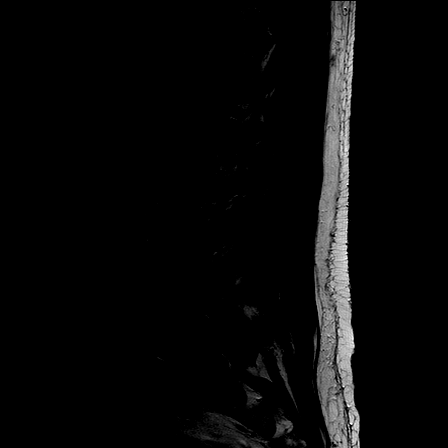
[im 5/13]
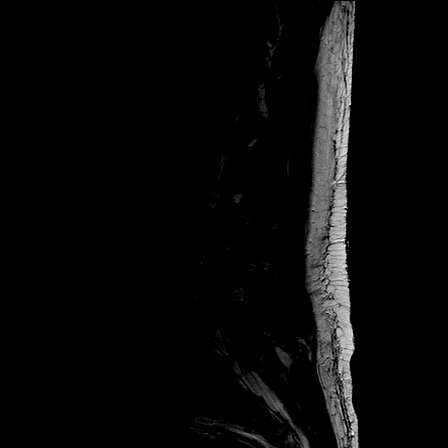
[im 8/13]
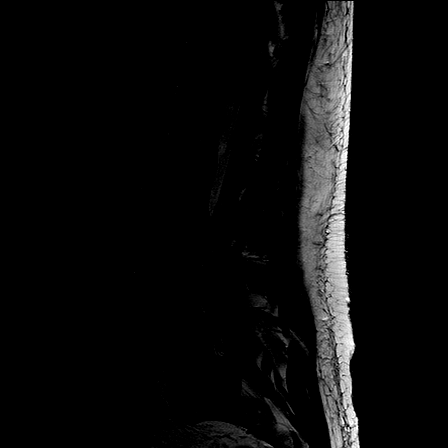
[im 10/13]
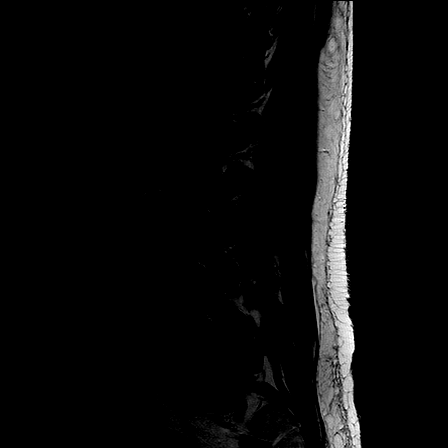
[im 13/13]
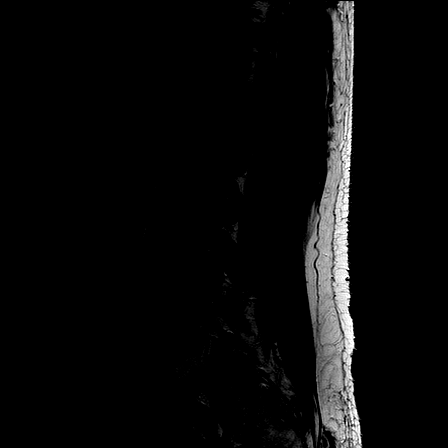

[Series 3: T1 · sagittal · 4.0mm · 0.73mm/px · 6 of 13 slices shown (1 of 2)]
[im 1/13]
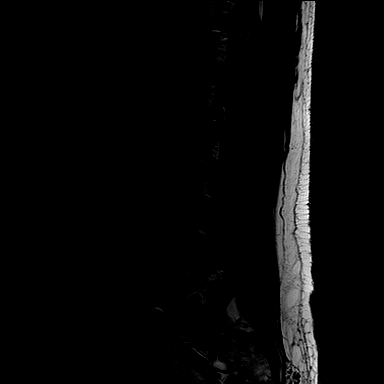
[im 3/13]
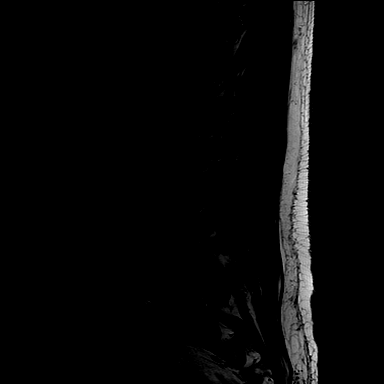
[im 5/13]
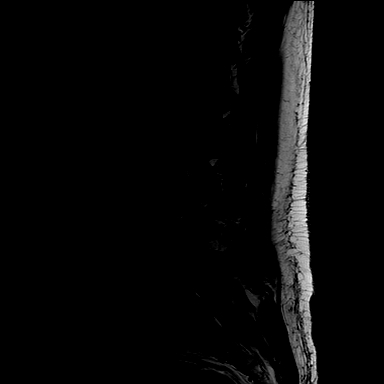
[im 8/13]
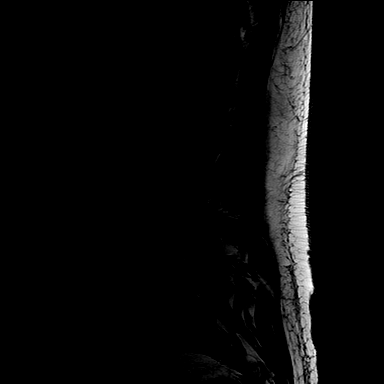
[im 10/13]
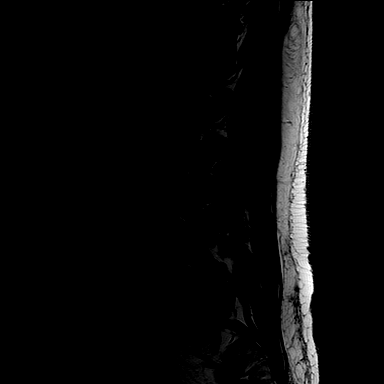
[im 13/13]
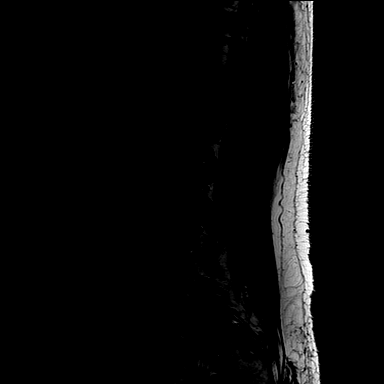

[Series 4: STIR · sagittal · 4.0mm · 0.55mm/px · 4 of 13 slices shown]
[im 1/13]
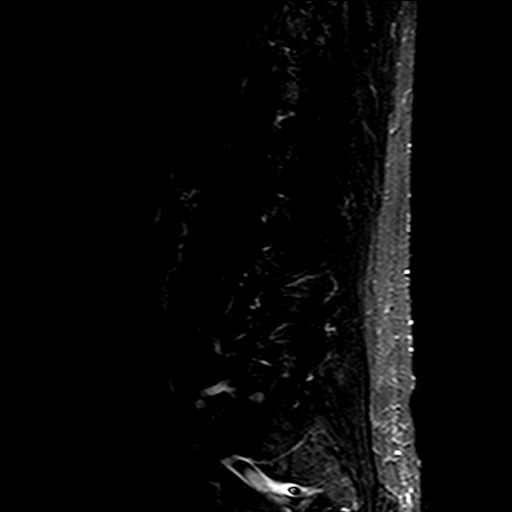
[im 3/13]
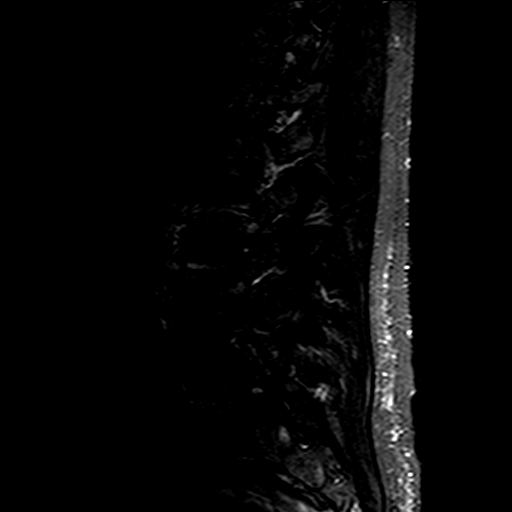
[im 5/13]
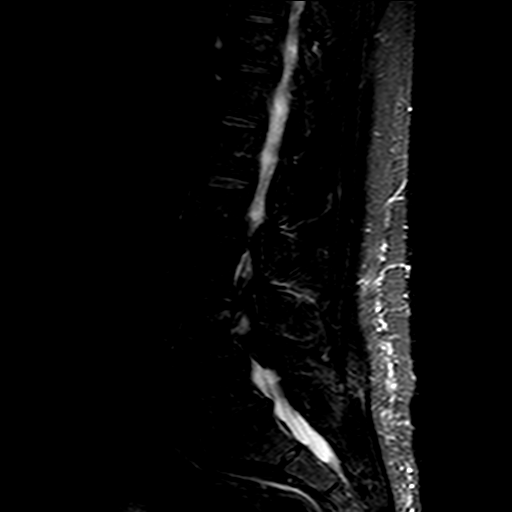
[im 8/13]
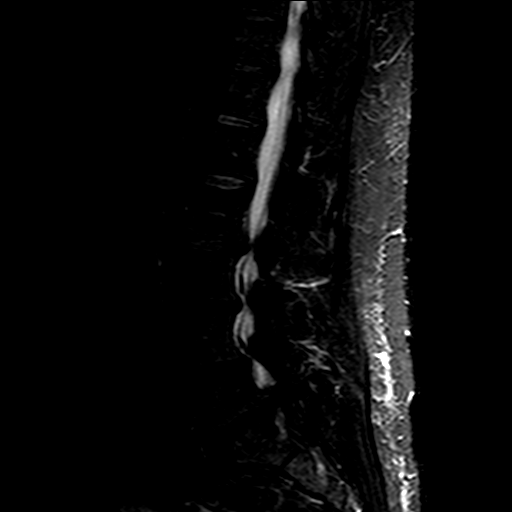

[Series 5: T2 · axial · 4.5mm · 0.49mm/px · z∈[-155,+35]mm · 9 of 30 slices shown (2 of 2)]
[im 1/30]
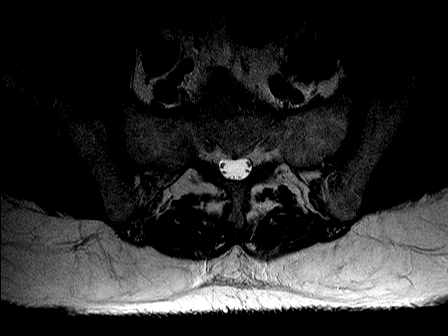
[im 5/30]
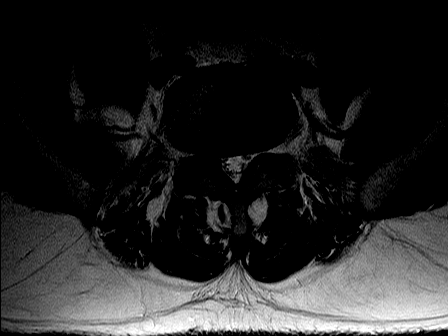
[im 9/30]
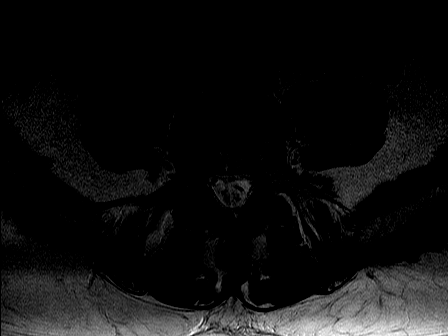
[im 13/30]
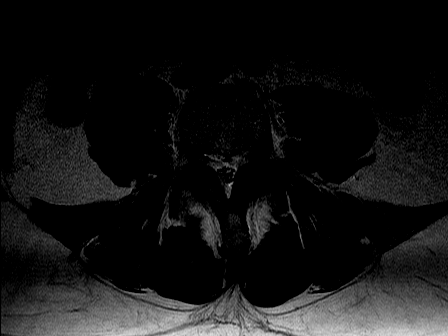
[im 15/30]
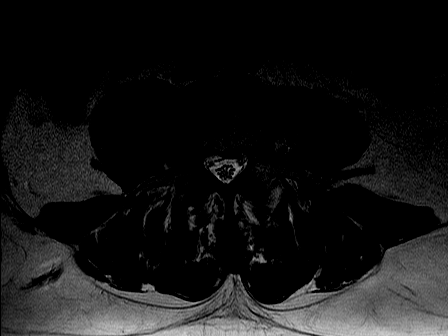
[im 17/30]
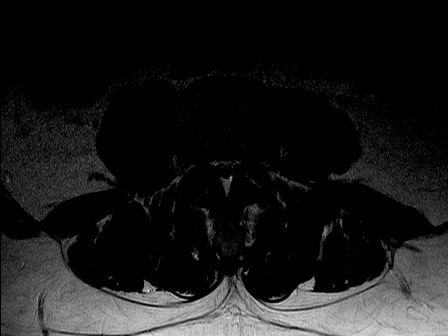
[im 21/30]
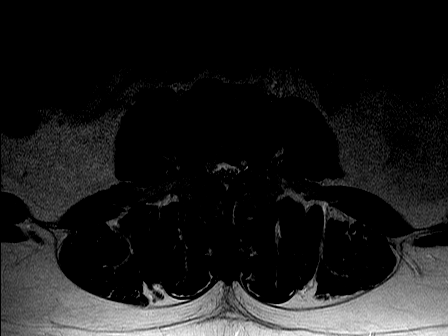
[im 25/30]
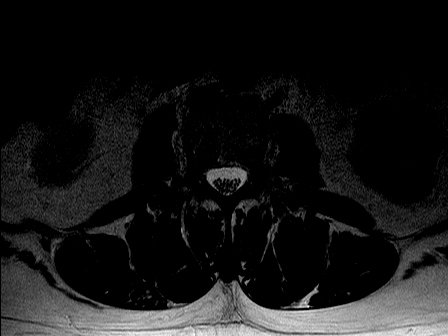
[im 30/30]
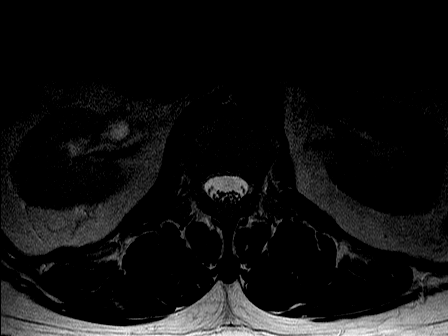

[Series 6: T1 · axial · 4.5mm · 0.86mm/px · z∈[-155,+35]mm · 9 of 30 slices shown (2 of 2)]
[im 1/30]
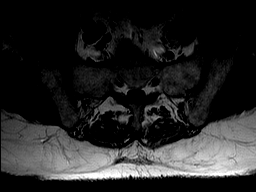
[im 5/30]
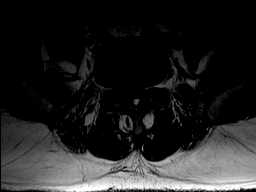
[im 9/30]
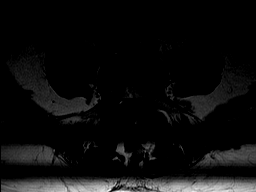
[im 13/30]
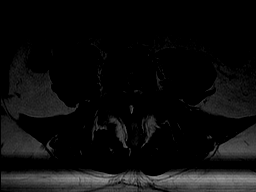
[im 15/30]
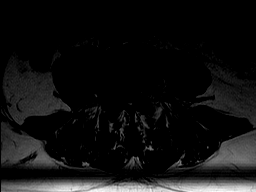
[im 17/30]
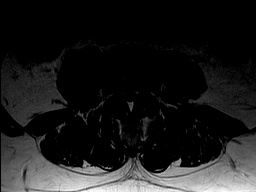
[im 21/30]
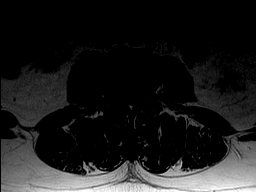
[im 25/30]
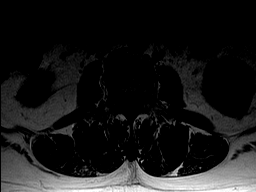
[im 30/30]
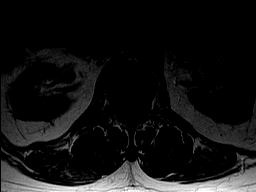

[34 of 48 positions shown; findings below may reference images not displayed]

DIAGNOSTIC STUDIES

EXAM

MRI lumbar spine without contrast.

INDICATION

severe disc degeneration
STARTED WITH ACUTE ONSET SHART PAIN TO HEEL, DOWN LEFT LEG.  FU ON L SPINE XRAY.  RG

TECHNIQUE

Sagittal and axial images were obtained with variable T1 and T2 weighting.

COMPARISONS

None available

FINDINGS

Conus medullaris is normal in signal intensity and location. The intervertebral discs at T12-L1 and
L1-2 are normal.

L2-3: There is a small left posterior paracentral disc extrusion image 6 series 2. This extends for
short distance below the disc level and causes slight deformity of the of the left ventral lateral
thecal sac image 10 series 5. In addition there is a small right foraminal disc protrusion at this
level image 10 series 2 causing minimal right neural foraminal stenosis in conjunction with
bilateral facet hypertrophy. Minimal central canal narrowing is seen at this level.

L3-4: Disc bulging and facet hypertrophy results in mild central canal narrowing and
mild-to-moderate bilateral neural foraminal stenosis.

L4-5: Disc bulging and facet hypertrophy is seen. There is moderate to severe central canal
narrowing and moderate to severe bilateral neural foraminal stenosis.

L5-S1: Broad based right lateral/foraminal disc protrusion is seen. In conjunction with prominent
facet hypertrophy, there is severe right neural foraminal stenosis and moderate to severe left
neural foraminal stenosis.

IMPRESSION

Small posterior left para central disc extrusion at L2-3 with minimal caudal extension. Also at L2-
3, there is a small right foraminal disc protrusion. Please see above discussion.

Mild central canal narrowing and mild-to-moderate bilateral neural foraminal stenosis at L3-4.

Moderate to severe central canal narrowing and moderate to severe bilateral neural foraminal
stenosis at L4-5.

Broad-based right lateral/foraminal disc protrusion at L5-S1 resulting in severe right neural
foraminal stenosis. Moderate left neural foraminal stenosis is seen at L5-S1.

Tech Notes:

STARTED WITH ACUTE ONSET SHART PAIN TO HEEL, DOWN LEFT LEG.  FU ON L SPINE XRAY.  RG

## 2021-01-24 ENCOUNTER — Encounter: Admit: 2021-01-24 | Discharge: 2021-01-24 | Payer: MEDICARE

## 2021-01-24 ENCOUNTER — Ambulatory Visit: Admit: 2021-01-24 | Discharge: 2021-01-24 | Payer: MEDICARE

## 2021-01-24 DIAGNOSIS — I729 Aneurysm of unspecified site: Secondary | ICD-10-CM

## 2021-01-24 NOTE — Progress Notes
IR Nurse Coordinator Assessment        Consult Date: 01/24/2021     Planned Procedure(s):  Aneurysm work up  Indication:  Cerebral aneurysm    ________________________________________________________________________    Chief Complaint:  aneurysm    History of Present Illness: Ronnie Williams is a 65 y.o. 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 Pt has recovered well from the stroke and is falling with neurology. He has a history of high cholesterol and HTN. He established care here at [facility] and was referred to Neuro IR for opinion on his aneurysm.  Active Problems:    * No active hospital problems. *    Nursing Medical History     Nursing Surgical History     Medical History:   Diagnosis Date   ? Hypertension    ? Seasonal allergic reaction    ? Stroke [facility])       Surgical History:   Procedure Laterality Date   ? HX EAR TUBES  02/25/2016   ? COLECTOMY          Medications:  For Outpatients and Same Day Surgery   No current facility-administered medications for this encounter.     Current Outpatient Medications   Medication Sig   ? allopurinoL (ZYLOPRIM) 300 mg tablet Take 300 mg by mouth daily.   ? aspirin EC 81 mg tablet Take 81 mg by mouth daily. Take with food.   ? chlorthalidone (HYGROTON) 25 mg tablet TAKE ONE-HALF TABLET BY MOUTH EVERY DAY   ? fexofenadine (ALLEGRA) 180 mg tablet 1 Tab, Q24H, PO, 0 Number of Refills   ? MEN'S MULTI-VITAMIN PO Take  by mouth.   ? OLMESARTAN MEDOXOMIL (BENICAR PO) Take 40 mg by mouth daily. Indications: 1 tablet   ? rosuvastatin (CRESTOR) 20 mg tablet Take 20 mg by mouth daily.   ? sodium chloride (SALINE MIST NA) Apply  into nose as directed.   ? triamcinolone (NASACORT) 55 mcg nasal inhaler Apply 2 sprays to each nostril as directed daily.   ? ubidecarenone (CO Q-10 PO) Take 100 mg by mouth.       Allergies:  Doxycycline, Latex, and Sulfa (sulfonamide antibiotics)    Social History     Socioeconomic History   ? Marital status: Married   Tobacco Use   ? Smoking status: Never Smoker   ? Smokeless tobacco: Never Used   Substance and Sexual Activity   ? Alcohol use: Yes     Comment: beer/wine 3 drinks/week.   ? Drug use: No     No family history on file.    Previous Anesthetic/Sedation History:  Yes   no adverse events          Lab Tests:  Labs: 24-hour labs:  No results found for this visit on 01/24/21 (from the past 24 hour(s))., Hematology:  No results found for: HGB, HCT, PLTCT, WBC, NEUT, ANC, LYMPH, ALC, ABSLYMPHCT, MONA, AMC, ABC, BASOPHILS, MCV, MCHC, MPV, RDW, Coagulation:  No results found for: PT, PTT, INR and General Chemistry:  No results found for: NA, K, CL, GAP, BUN, CR, GLU, CA, KETONES, ALBUMIN, LACTIC, OBSCA, MG, TOTBILI    Child Pugh Score:    ECOG Performance Status:    Imaging/Diagnostic Tests:  [facility]H CTA  Treatment Plan:  The patient was seen and examined by [name] who formulated the following assessment and plan.  Discussed aneurysms with patient and souse. Calculated patients phases score of 0.7% risk of rupture in 5 years. Pt has small 2.57mm aneurysm. Discussed lifestyle modifcation with keeping BP controlled and cholesterol medication on board. Discussed need to follow aneurysm yearly with CTA, and should abeurysm grow then would discuss treatment options.     Chrystie Nose, RN

## 2021-01-29 ENCOUNTER — Encounter: Admit: 2021-01-29 | Discharge: 2021-01-29 | Payer: MEDICARE

## 2021-01-29 NOTE — Telephone Encounter
RN rescheduled patient from 02/20/21 to 02/28/21 for right shoulder arthroscopy, rotator cuff repair, biceps tenotomy, and distal clavicle excision. Questions answered, instructed patient to call [phone] with any other questions or concerns.

## 2021-02-11 ENCOUNTER — Encounter: Admit: 2021-02-11 | Discharge: 2021-02-11 | Payer: MEDICARE

## 2021-02-11 ENCOUNTER — Ambulatory Visit: Admit: 2021-02-11 | Discharge: 2021-02-11 | Payer: MEDICARE

## 2021-02-11 DIAGNOSIS — I639 Cerebral infarction, unspecified: Secondary | ICD-10-CM

## 2021-02-11 DIAGNOSIS — I779 Disorder of arteries and arterioles, unspecified: Secondary | ICD-10-CM

## 2021-02-11 DIAGNOSIS — E7800 Pure hypercholesterolemia, unspecified: Secondary | ICD-10-CM

## 2021-02-11 DIAGNOSIS — G4733 Obstructive sleep apnea (adult) (pediatric): Secondary | ICD-10-CM

## 2021-02-11 DIAGNOSIS — K5792 Diverticulitis of intestine, part unspecified, without perforation or abscess without bleeding: Secondary | ICD-10-CM

## 2021-02-11 DIAGNOSIS — Z789 Other specified health status: Secondary | ICD-10-CM

## 2021-02-11 DIAGNOSIS — M1990 Unspecified osteoarthritis, unspecified site: Secondary | ICD-10-CM

## 2021-02-11 DIAGNOSIS — K429 Umbilical hernia without obstruction or gangrene: Secondary | ICD-10-CM

## 2021-02-11 DIAGNOSIS — I671 Cerebral aneurysm, nonruptured: Secondary | ICD-10-CM

## 2021-02-11 DIAGNOSIS — I10 Essential (primary) hypertension: Secondary | ICD-10-CM

## 2021-02-11 DIAGNOSIS — J302 Other seasonal allergic rhinitis: Secondary | ICD-10-CM

## 2021-02-11 DIAGNOSIS — Z6832 Body mass index (BMI) 32.0-32.9, adult: Secondary | ICD-10-CM

## 2021-02-11 DIAGNOSIS — M19011 Primary osteoarthritis, right shoulder: Secondary | ICD-10-CM

## 2021-02-11 LAB — BASIC METABOLIC PANEL
CHLORIDE: 104 MMOL/L (ref 98–110)
CO2: 25 MMOL/L (ref 21–30)
POTASSIUM: 4.1 MMOL/L (ref 3.5–5.1)
SODIUM: 138 MMOL/L (ref 137–147)

## 2021-02-11 NOTE — Patient Instructions
General Instructions for Surgery Patients    You are scheduled for surgery on: 11/11    Your surgeon is: Dr. Milus Banister                                      Your nurse coordinator is:  Dan Maker RN     Contact Information:  Main Office: [phone], hours Monday thru Friday 8:00am-4:30pm option #1.      If you are unable to contact your physician, please contact same day surgery [phone]    When to arrive  A representative from the operating room will call after 3pm on the last business day before your procedure with information about when you should arrive at the hospital. If your procedure is scheduled for Monday, you will receive this call on Friday.    If you do not receive a call from the operating room representative about your arrival time by 4pm, call the Preoperative Evaluation Clinic.  Between 3:30pm and 6pm - Call [phone] or [phone] (from [place] only). After 6pm - Call [phone].    If you are coming from out of town and staying overnight in , please provide [place] with your local number. It is very important that we are able to contact you.    Parking    Park in the P5 parking garage off of [address].    Enter the [facility] through the 1st floor main Entrance and check in with    admitting on 1st floor.     Bringing Visitors   As a courtesy to other families and visitors, please limit the number of your waiting room  visitors to ONE.    What to bring?   Bring a list of all medications you are currently taking. Bring your insurance information or insurance/prescription cards. If you have a living will or a durable power of attorney, please bring a copy to be included in your chart. Please bring appropriate containers for glasses, contacts, dentures or partials. Do not bring money, jewelry or other valuables. The hospital is not responsible for loss or breakage of personal items.    Preparing for Your Procedure  Eating and Drinking   DO NOT eat or drink anything after 12:00 midnight on the night before your surgery. You may brush your teeth and rinse your mouth, but your stomach should be completely empty. (No water, No coffee, No gum, candy or mints.)    Hygiene  Bathe or shower the night before or morning of your procedure.    What to Wear  Dress comfortably. A gown and slippers will be provided for you. Remove all jewelry and body piercings. Do not wear make-up or fingernail polish.      You must have someone available to drive you home after your surgery.  Notify Your Physician If you become ill the day before your procedure with fever, cold symptoms, sore throat, nausea, vomiting or other illness.        [facility] Address and Instructions    [address]  [place], [place] [address]    [facility] is on the main hospital campus at the [employer] of [address] and [employer].     We are pleased to serve you and appreciate your trust in our care. On the day of your procedure:  Park in the P5 parking garage on the  corner of [address] and [address]. The fee is $3 with validation; bring your ticket to any information desk for validation.  Enter [facility] through the main entrance on the street level.  Check in at the admitting desk on Level 1.  When your registration is complete, check in with the waiting room attendant. You will be escorted to the location of your procedure.  Your family or friends may wait comfortably in the common areas on Level 1.

## 2021-02-11 NOTE — Progress Notes
HISTORY OF PRESENT ILLNESS:  Ronnie Williams is a 65 y.o. male who presents to clinic today as a new patient to my clinic but established with Dr. Juel Burrow for eustachian tube dysfunction what brought him in to see me today was obstructive sleep apnea with CPAP intolerance and is interested in inspire.  The patient had a stroke in February 2022.  He had a sleep study in September which shows an AHI of 18.7 was 0 central apneas but desaturations down to 71% and an hour where his saturations were less than 90%.  He has had the need for CPAP for over 7 to 8 years.  He has been on it fairly consistently since 2018.  He has difficulty tolerating it because of mask leaks.  He has tried multiple different masks.  He feels he sleeps better without it than with it.           Review of Systems   Constitutional: Negative.    HENT: Negative.    Eyes: Negative.    Respiratory: Negative.    Cardiovascular: Negative.    Gastrointestinal: Negative.    Endocrine: Negative.    Genitourinary: Negative.    Musculoskeletal: Negative.    Skin: Negative.    Allergic/Immunologic: Negative.    Neurological: Negative.    Hematological: Negative.    Psychiatric/Behavioral: Negative.        Past Medical/Surgical History  He  has a past medical history of Arthritis, Carotid artery disease (HCC), Cerebral aneurysm, Diverticulitis, High cholesterol, Hypertension, OSA on CPAP, Seasonal allergic reaction, Stroke (HCC) (2014), Stroke (HCC) (06/2020), and Umbilical hernia.  His  has a past surgical history that includes ear tubes (02/25/2016); colectomy; tonsillectomy; and adenoidectomy.        Medications/Allergies/Immunizations  His current medication(s) include:   Current Outpatient Medications   Medication Sig Dispense Refill   ? allopurinoL (ZYLOPRIM) 100 mg tablet Take 200 mg by mouth at bedtime daily. Take with food.     ? allopurinoL (ZYLOPRIM) 300 mg tablet Take 300 mg by mouth daily. Take with food.     ? aspirin EC 81 mg tablet Take 81 mg by mouth daily. Take with food.     ? chlorthalidone (HYGROTON) 25 mg tablet Take 12.5 mg by mouth every morning.     ? coQ10 (ubiquinol) 100 mg cap Take 1 capsule by mouth at bedtime daily.     ? fexofenadine (ALLEGRA) 180 mg tablet Take 180 mg by mouth daily.     ? glucosamine-D3-Boswellia serr 1,500-400-100 mg-unit-mg tab Take 1 tablet by mouth daily.     ? MEN'S MULTI-VITAMIN PO Take 1 tablet by mouth daily.     ? olmesartan (BENICAR) 40 mg tablet Take 40 mg by mouth daily.     ? Potassium Gluconate 595 mg (99 mg) tab Take 595 mg by mouth daily.     ? rosuvastatin (CRESTOR) 20 mg tablet Take 20 mg by mouth at bedtime daily.     ? sodium chloride (SEA MIST) 0.65 % nasal spray Apply 2 sprays to each nostril as directed as Needed.     ? sour cherry extract (TART CHERRY EXTRACT) 1,000 mg cap Take 1 capsule by mouth daily.     ? triamcinolone (NASACORT) 55 mcg nasal inhaler Apply 2 sprays to each nostril as directed daily.       No current facility-administered medications for this visit.       Allergies: Doxycycline, Latex, Levaquin [levofloxacin], Plavix [clopidogrel], and Sulfa (sulfonamide antibiotics)  Vitals:    02/11/21 1329   BP: 135/75   BP Source: Arm, Right Upper   Pulse: 61   Temp: 36.8 ?C (98.3 ?F)   TempSrc: Skin   PainSc: Zero   Weight: 93.1 kg (205 lb 3.2 oz)   Height: 170.2 cm (5' 7)     Body mass index is 32.14 kg/m?Marland Kitchen     Physical Exam    General: 65 y.o. male who is awake and alert and no acute distress.  SKIN:  Skin examination was unremarkable no mass or lesion appreciated no evidence of cellulitis.  No evidence of skin cancers.    EYES: Extraocular muscle mobility is intact.  No conjunctival hemorrhage.   EARS:The auricles were without deformity.  External auditory canals are clear no evidence of cerumen fungus or bacterial infection.  Tympanic membranes are without effusion or retraction.  No evidence of perforation.  No cholesteatoma.    NOSE: The nasal airway shows a straight septum without evidence of perforation or significant crusting.  There are no evidence of polypoid changes.  The inferior turbinate is not congested.  There are no active bleeding sites.    ORAL CAVITY: The oral cavity shows buccal surfaces are without evidence of lichenoid changes or mucosal disease.  No leukoplakia.  The floor of mouth is without edema.  Wharton's ducts and Stensen's ducts are patent with normal salivary flow.  The tongue is without mass or lesion. There is normal mobility and sensation.    OROPHARYNX: The oropharynx shows normal mucosa.   No significant postnasal drainage.  Uvula is without edema.  No obstruction in the oropharynx from tonsil or uvula  NECK: Neck was flat no adenopathy or thyromegaly.  No parotid masses or submandibular gland masses.    NEURO: Cranial nerves are intact bilaterally.  Voice quality is excellent.   PULMONARY:  No airway distress.  No stridor.  No retractions.        ASSESSMENT AND PLAN:       Ronnie Williams has a BMI of 32 with an AHI of 18.7 on a recent sleep study and no central apneas.  He has been on and off CPAP over the past 7 to 8 years.  He has had a recent stroke.  He does get desaturations that can be quite significant.  Because he does not tolerate CPAP well he was interested in hypoglossal nerve stimulator placement.  We discussed the risk complications as well as benefit of inspire.  We discussed the need for drug-induced sleep endoscopy and shared medical decision making as his next steps.  Discussed the tensional for bleeding, infection, continued sleep apnea, pneumothorax, device failure, need for battery change, occult he was shooting rifles which she only does rarely, ability to do most MRI scans and the activation process which would occur about a month after surgery.  He is wanting to proceed with further work-up

## 2021-02-11 NOTE — Anesthesia Pre-Procedure Evaluation
Anesthesia Pre-Procedure Evaluation    Name: Ronnie Williams      MRN: [id number]     DOB: 24-May-1955     Age: 65 y.o.     Sex: male   _________________________________________________________________________     Procedure Info:   Procedure Information     Date/Time: 02/28/21 1444    Procedures:       right shoulder arthroscopy, rotator cuff repair (Right ) - 1.5 hrs.      right shoulder arthroscopy,  distal clavicle excision (Right )      right shoulder arthroscopy,  biceps tenotomy (Right )    Location: [facility] OR [facility] / [facility] MAIN OR/PER[facility]    Surgeons: Dolan Amen, MD          Physical Assessment  Vital Signs (last filed in past 24 hours):  BP: 135/74 (09/27 0957)  Temp: 36.6 ?C (97.9 ?F) (09/27 0957)  Pulse: 66 (09/27 0957)  Respirations: 13 PER MINUTE (09/27 0957)  SpO2: 98 % (09/27 0957)  O2 Device: None (Room air) (09/27 0957)  Height: 170.2 cm (5' 7) (09/27 0957)  Weight: 93 kg (205 lb) (09/27 0957)  Admission / Dosing Weight: 93 kg (205 lb) (09/27 0957)      Patient History   Allergies   Allergen Reactions   ? Doxycycline MUSCLE PAIN   ? Latex ITCHING, SEE COMMENTS and REDNESS     With prolonged exposure   ? Levaquin [Levofloxacin] SEE COMMENTS     Body aches and shakes   ? Plavix [Clopidogrel] SEE COMMENTS     Joint pain and muscle pain   ? Sulfa (Sulfonamide Antibiotics) ITCHING        Current Medications    Medication Directions   allopurinoL (ZYLOPRIM) 100 mg tablet Take 200 mg by mouth at bedtime daily. Take with food.   allopurinoL (ZYLOPRIM) 300 mg tablet Take 300 mg by mouth daily. Take with food.   aspirin EC 81 mg tablet Take 81 mg by mouth daily. Take with food.   chlorthalidone (HYGROTON) 25 mg tablet Take 12.5 mg by mouth every morning.   coQ10 (ubiquinol) 100 mg cap Take 1 capsule by mouth at bedtime daily.   fexofenadine (ALLEGRA) 180 mg tablet Take 180 mg by mouth daily.   glucosamine-D3-Boswellia serr (OSTEO BI-FLEX (5-LOXIN)) 1,500-400-100 mg-unit-mg tab Take 1 tablet by mouth daily. MEN'S MULTI-VITAMIN PO Take 1 tablet by mouth daily.   olmesartan (BENICAR) 40 mg tablet Take 40 mg by mouth daily.   Potassium Gluconate 595 mg (99 mg) tab Take 595 mg by mouth daily.   rosuvastatin (CRESTOR) 20 mg tablet Take 20 mg by mouth at bedtime daily.   sodium chloride (SEA MIST) 0.65 % nasal spray Apply 2 sprays to each nostril as directed as Needed.   sour cherry extract (TART CHERRY EXTRACT) 1,000 mg cap Take 1 capsule by mouth daily.   triamcinolone (NASACORT) 55 mcg nasal inhaler Apply 2 sprays to each nostril as directed daily.       Review of Systems/Medical History      Patient summary reviewed  Nursing notes reviewed  Pertinent labs reviewed    PONV Screening: Non-smoker  No history of anesthetic complications  No family history of anesthetic complications      Airway - negative        Pulmonary           No indications/hx of asthma    no COPD      No recent URI  Sleep apnea          Interventions: CPAP; compliant      Cardiovascular       Recent diagnostic studies:          stress test      Exercise tolerance: >4 METS (8.97 per [facility])      Beta Blocker therapy: No      Beta blockers within 24 hours: n/a        Hypertension, well controlled      No past MI,       No hx of coronary artery disease      No DVT      No indications/hx of CHF      Hyperlipidemia      No dyspnea on exertion      GI/Hepatic/Renal         No GERD,       No hx of liver disease     No renal disease      Neuro/Psych       No seizures      CVA (2014 received tPA. Feb 2022 was out of tPA window, no residual symptoms)      No chronic opioid use      No chronic benzodiazepine use      MCA aneurysm - saw [name] on 01/24/2021 and deemed low risk for rupture but will be following with yearly imaging      Musculoskeletal         No neck pain      Arthritis      Endocrine/Other       No diabetes      No hypothyroidism      No hyperthyroidism      No history of blood transfusion    Constitution - negative   Physical Exam    Airway Findings      Mallampati: III      TM distance: >3 FB      Neck ROM: full      Mouth opening: good      Airway patency: adequate    Dental Findings:             Cardiovascular Findings:       Rhythm: regular      Rate: normal    Pulmonary Findings:       Breath sounds clear to auscultation.    Abdominal Findings:         Abdominal exam deferred    Neurological Findings:       Alert and oriented x 3    Normal mental status    Constitutional findings:       No acute distress      Well-developed      Well-nourished       Diagnostic Tests  Hematology: No results found for: HGB, HCT, PLTCT, WBC, NEUT, ANC, LYMPH, ALC, ABSLYMPHCT, MONA, AMC, EOSA, ABC, BASOPHILS, MCV, MCH, MCHC, MPV, RDW      General Chemistry: No results found for: NA, K, CL, CO2, GAP, BUN, CR, GLU, CA, KETONES, ALBUMIN, LACTIC, OBSCA, MG, TOTBILI, TOTBILCB, PO4   Coagulation: No results found for: PT, PTT, INR    08/13/2016 Treadmill Stress  ? Near-maximal, nonischemic exercise stress electrocardiogram.  ? No symptoms suggestive of angina.   ? Good functional capacity.  ? No stress induced ectopy or arrhythmias.  ? Borderline hypertensive response with exercise (204/68 mmHg).   ? Low risk Duke Treadmill Score.   Pulmonary-to-Myocardial Count  Ratio: 0.35  (normal = or < 0.52).    08/21/20 CTA Head and Neck with Contrast:  Occluded left ICA at the origin with retrograde flow into the distal ICA and with patent flow into the left ophthalmic artery. Minimal 2.5 mm aneurysm along the right MCA M1/M2 junction of the proximal M2 inferior division branch.    01/01/21 Carotid US:  Conclusions: <50% right internal carotid artery stenosis.   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   Antegrade flow in the vertebral arteries.     Anesthesia Plan    ASA score: 3   Plan: general and regional for postoperative pain  Induction method: intravenous      Informed Consent  Anesthetic plan and risks discussed with patient and spouse.  Use of blood products discussed with patient and spouse  Blood Consent: consented      Plan discussed with: anesthesiologist and resident.

## 2021-02-14 ENCOUNTER — Encounter: Admit: 2021-02-14 | Discharge: 2021-02-14 | Payer: MEDICARE

## 2021-02-14 DIAGNOSIS — Z789 Other specified health status: Secondary | ICD-10-CM

## 2021-02-14 DIAGNOSIS — G4733 Obstructive sleep apnea (adult) (pediatric): Secondary | ICD-10-CM

## 2021-02-17 ENCOUNTER — Ambulatory Visit: Admit: 2021-02-17 | Discharge: 2021-02-17 | Payer: MEDICARE

## 2021-02-17 DIAGNOSIS — G4733 Obstructive sleep apnea (adult) (pediatric): Secondary | ICD-10-CM

## 2021-02-17 DIAGNOSIS — Z789 Other specified health status: Secondary | ICD-10-CM

## 2021-02-28 ENCOUNTER — Encounter: Admit: 2021-02-28 | Discharge: 2021-02-28 | Payer: MEDICARE

## 2021-02-28 ENCOUNTER — Ambulatory Visit: Admit: 2021-02-28 | Discharge: 2021-02-28 | Payer: MEDICARE

## 2021-02-28 DIAGNOSIS — E7800 Pure hypercholesterolemia, unspecified: Secondary | ICD-10-CM

## 2021-02-28 DIAGNOSIS — K5792 Diverticulitis of intestine, part unspecified, without perforation or abscess without bleeding: Secondary | ICD-10-CM

## 2021-02-28 DIAGNOSIS — J302 Other seasonal allergic rhinitis: Secondary | ICD-10-CM

## 2021-02-28 DIAGNOSIS — G4733 Obstructive sleep apnea (adult) (pediatric): Secondary | ICD-10-CM

## 2021-02-28 DIAGNOSIS — I671 Cerebral aneurysm, nonruptured: Secondary | ICD-10-CM

## 2021-02-28 DIAGNOSIS — I779 Disorder of arteries and arterioles, unspecified: Secondary | ICD-10-CM

## 2021-02-28 DIAGNOSIS — I639 Cerebral infarction, unspecified: Secondary | ICD-10-CM

## 2021-02-28 DIAGNOSIS — M1990 Unspecified osteoarthritis, unspecified site: Secondary | ICD-10-CM

## 2021-02-28 DIAGNOSIS — K429 Umbilical hernia without obstruction or gangrene: Secondary | ICD-10-CM

## 2021-02-28 DIAGNOSIS — I10 Essential (primary) hypertension: Secondary | ICD-10-CM

## 2021-02-28 MED ORDER — SUGAMMADEX 100 MG/ML IV SOLN
INTRAVENOUS | 0 refills | Status: DC
Start: 2021-02-28 — End: 2021-02-28
  Administered 2021-02-28: 22:00:00 178 mg via INTRAVENOUS

## 2021-02-28 MED ORDER — PROPOFOL INJ 10 MG/ML IV VIAL
INTRAVENOUS | 0 refills | Status: DC
Start: 2021-02-28 — End: 2021-02-28
  Administered 2021-02-28: 20:00:00 180 mg via INTRAVENOUS
  Administered 2021-02-28: 21:00:00 20 mg via INTRAVENOUS

## 2021-02-28 MED ORDER — DEXAMETHASONE SODIUM PHOSPHATE 10 MG/ML IJ SOLN
0 refills | Status: CP
Start: 2021-02-28 — End: ?
  Administered 2021-02-28: 19:00:00 10 mg

## 2021-02-28 MED ORDER — ROPIVACAINE (PF) 5 MG/ML (0.5 %) IJ SOLN
0 refills | Status: CP
Start: 2021-02-28 — End: ?
  Administered 2021-02-28: 19:00:00 25 mL

## 2021-02-28 MED ORDER — DEXAMETHASONE SODIUM PHOSPHATE 4 MG/ML IJ SOLN
INTRAVENOUS | 0 refills | Status: DC
Start: 2021-02-28 — End: 2021-02-28
  Administered 2021-02-28: 20:00:00 4 mg via INTRAVENOUS

## 2021-02-28 MED ORDER — ARTIFICIAL TEARS (PF) SINGLE DOSE DROPS GROUP
OPHTHALMIC | 0 refills | Status: DC
Start: 2021-02-28 — End: 2021-02-28
  Administered 2021-02-28: 20:00:00 2 [drp] via OPHTHALMIC

## 2021-02-28 MED ORDER — LIDOCAINE (PF) 200 MG/10 ML (2 %) IJ SYRG
INTRAVENOUS | 0 refills | Status: DC
Start: 2021-02-28 — End: 2021-02-28
  Administered 2021-02-28: 20:00:00 100 mg via INTRAVENOUS

## 2021-02-28 MED ORDER — MIDAZOLAM 1 MG/ML IJ SOLN
INTRAVENOUS | 0 refills | Status: CP
Start: 2021-02-28 — End: ?
  Administered 2021-02-28: 19:00:00 2 mg via INTRAVENOUS

## 2021-02-28 MED ORDER — PHENYLEPHRINE HCL IN 0.9% NACL 1 MG/10 ML (100 MCG/ML) IV SYRG
INTRAVENOUS | 0 refills | Status: DC
Start: 2021-02-28 — End: 2021-02-28
  Administered 2021-02-28 (×3): 100 ug via INTRAVENOUS
  Administered 2021-02-28: 21:00:00 50 ug via INTRAVENOUS
  Administered 2021-02-28: 22:00:00 100 ug via INTRAVENOUS
  Administered 2021-02-28: 21:00:00 50 ug via INTRAVENOUS

## 2021-02-28 MED ORDER — ONDANSETRON HCL (PF) 4 MG/2 ML IJ SOLN
INTRAVENOUS | 0 refills | Status: DC
Start: 2021-02-28 — End: 2021-02-28
  Administered 2021-02-28: 22:00:00 4 mg via INTRAVENOUS

## 2021-02-28 MED ORDER — ROCURONIUM 10 MG/ML IV SOLN
INTRAVENOUS | 0 refills | Status: DC
Start: 2021-02-28 — End: 2021-02-28
  Administered 2021-02-28: 21:00:00 10 mg via INTRAVENOUS
  Administered 2021-02-28: 20:00:00 50 mg via INTRAVENOUS

## 2021-02-28 MED ADMIN — FENTANYL CITRATE (PF) 50 MCG/ML IJ SOLN [3037]: 50 ug | INTRAVENOUS | @ 23:00:00 | Stop: 2021-03-01 | NDC 00641602701

## 2021-02-28 MED ADMIN — OXYCODONE 5 MG PO TAB [10814]: 5 mg | ORAL | @ 18:00:00 | Stop: 2021-02-28 | NDC 00904696661

## 2021-02-28 MED ADMIN — OXYCODONE 5 MG PO TAB [10814]: 10 mg | ORAL | @ 22:00:00 | Stop: 2021-02-28 | NDC 00904696661

## 2021-02-28 MED ADMIN — ACETAMINOPHEN 1,000 MG/100 ML (10 MG/ML) IV SOLN [305632]: 1000 mg | INTRAVENOUS | @ 23:00:00 | Stop: 2021-02-28 | NDC 67457094010

## 2021-02-28 MED ADMIN — CEFAZOLIN 1 GRAM IJ SOLR [1445]: 2 g | INTRAVENOUS | @ 20:00:00 | Stop: 2021-02-28 | NDC 60505614200

## 2021-02-28 MED ADMIN — SODIUM CHLORIDE 0.9% IRRIGATION BAG [210330]: 12000 mL | @ 20:00:00 | Stop: 2021-02-28 | NDC 00338004747

## 2021-02-28 MED ADMIN — EPINEPHRINE 1 MG/ML IJ SOLN [2850]: 12000 mL | @ 20:00:00 | Stop: 2021-02-28 | NDC 42023016801

## 2021-02-28 MED ADMIN — LACTATED RINGERS IV SOLP [4318]: 1000.000 mL | INTRAVENOUS | @ 21:00:00 | Stop: 2021-03-01 | NDC 00338011704

## 2021-02-28 MED ADMIN — KETOROLAC 30 MG/ML (1 ML) IJ SOLN [22473]: 30 mg | INTRAVENOUS | @ 23:00:00 | Stop: 2021-02-28 | NDC 72611072201

## 2021-02-28 MED ADMIN — HYDROMORPHONE (PF) 2 MG/ML IJ SYRG [163476]: 0.5 mg | INTRAVENOUS | @ 23:00:00 | Stop: 2021-02-28 | NDC 00409336511

## 2021-02-28 MED ADMIN — ACETAMINOPHEN 500 MG PO TAB [102]: 1000 mg | ORAL | @ 18:00:00 | Stop: 2021-02-28 | NDC 00904673080

## 2021-02-28 MED ADMIN — LACTATED RINGERS IV SOLP [4318]: 1000.000 mL | INTRAVENOUS | @ 18:00:00 | Stop: 2021-03-01 | NDC 00338011704

## 2021-02-28 MED FILL — OXYCODONE-ACETAMINOPHEN 10-325 MG PO TAB: 10/325 mg | ORAL | 7 days supply | Qty: 40 | Fill #1 | Status: CP

## 2021-03-01 MED ADMIN — ALLOPURINOL 100 MG PO TAB [310]: 200 mg | ORAL | @ 02:00:00 | NDC 00904704161

## 2021-03-01 MED ADMIN — CEFAZOLIN INJ 1GM IVP [210319]: 2 g | INTRAVENOUS | @ 04:00:00 | Stop: 2021-03-01 | NDC 60505614200

## 2021-03-01 MED ADMIN — OXYCODONE 5 MG PO TAB [10814]: 10 mg | ORAL | @ 08:00:00 | Stop: 2021-03-01 | NDC 00904696661

## 2021-03-01 MED ADMIN — CHLORTHALIDONE 25 MG PO TAB [1661]: 12.5 mg | ORAL | @ 14:00:00 | Stop: 2021-03-01 | NDC 00904690061

## 2021-03-01 MED ADMIN — FLU VACC QS2022-23 6MOS UP(PF) 60 MCG (15 MCG X 4)/0.5 ML IM SYRG [459806]: 0.5 mL | INTRAMUSCULAR | @ 14:00:00 | Stop: 2021-03-01 | NDC 19515080841

## 2021-03-01 MED ADMIN — WATER FOR INJECTION, STERILE IJ SOLN [79513]: 20 mL | INTRAVENOUS | @ 12:00:00 | Stop: 2021-03-01 | NDC 63323018508

## 2021-03-01 MED ADMIN — ASPIRIN 81 MG PO TBEC [14113]: 81 mg | ORAL | @ 14:00:00 | Stop: 2021-03-01 | NDC 49483048112

## 2021-03-01 MED ADMIN — WATER FOR INJECTION, STERILE IJ SOLN [79513]: 20 mL | INTRAVENOUS | @ 04:00:00 | Stop: 2021-03-01 | NDC 63323018508

## 2021-03-01 MED ADMIN — DOCUSATE SODIUM 100 MG PO CAP [2566]: 100 mg | ORAL | @ 02:00:00 | NDC 00904718361

## 2021-03-01 MED ADMIN — ROSUVASTATIN 20 MG PO TAB [88504]: 20 mg | ORAL | @ 02:00:00 | NDC 00904678061

## 2021-03-01 MED ADMIN — LORATADINE 10 MG PO TAB [10466]: 10 mg | ORAL | @ 14:00:00 | Stop: 2021-03-01 | NDC 00904685261

## 2021-03-01 MED ADMIN — OXYCODONE 5 MG PO TAB [10814]: 15 mg | ORAL | @ 15:00:00 | Stop: 2021-03-01 | NDC 00904696661

## 2021-03-01 MED ADMIN — VALSARTAN 160 MG PO TAB [83018]: 160 mg | ORAL | @ 14:00:00 | Stop: 2021-03-01 | NDC 60687063411

## 2021-03-01 MED ADMIN — OXYCODONE 5 MG PO TAB [10814]: 10 mg | ORAL | @ 03:00:00 | NDC 00904696661

## 2021-03-01 MED ADMIN — MAGNESIUM HYDROXIDE 2,400 MG/10 ML PO SUSP [136089]: 10 mL | ORAL | @ 02:00:00 | NDC 00121094010

## 2021-03-01 MED ADMIN — ALLOPURINOL 300 MG PO TAB [311]: 300 mg | ORAL | @ 14:00:00 | Stop: 2021-03-01 | NDC 00904657261

## 2021-03-01 MED ADMIN — DOCUSATE SODIUM 100 MG PO CAP [2566]: 100 mg | ORAL | @ 14:00:00 | Stop: 2021-03-01 | NDC 00904718361

## 2021-03-01 MED ADMIN — OXYCODONE 5 MG PO TAB [10814]: 10 mg | ORAL | @ 12:00:00 | Stop: 2021-03-01 | NDC 00904696661

## 2021-03-01 MED ADMIN — CEFAZOLIN INJ 1GM IVP [210319]: 2 g | INTRAVENOUS | @ 12:00:00 | Stop: 2021-03-01 | NDC 60505614200

## 2021-03-02 ENCOUNTER — Encounter: Admit: 2021-03-02 | Discharge: 2021-03-02 | Payer: MEDICARE

## 2021-03-02 DIAGNOSIS — I779 Disorder of arteries and arterioles, unspecified: Secondary | ICD-10-CM

## 2021-03-02 DIAGNOSIS — G4733 Obstructive sleep apnea (adult) (pediatric): Secondary | ICD-10-CM

## 2021-03-02 DIAGNOSIS — I639 Cerebral infarction, unspecified: Secondary | ICD-10-CM

## 2021-03-02 DIAGNOSIS — M1990 Unspecified osteoarthritis, unspecified site: Secondary | ICD-10-CM

## 2021-03-02 DIAGNOSIS — I10 Essential (primary) hypertension: Secondary | ICD-10-CM

## 2021-03-02 DIAGNOSIS — K5792 Diverticulitis of intestine, part unspecified, without perforation or abscess without bleeding: Secondary | ICD-10-CM

## 2021-03-02 DIAGNOSIS — I671 Cerebral aneurysm, nonruptured: Secondary | ICD-10-CM

## 2021-03-02 DIAGNOSIS — E7800 Pure hypercholesterolemia, unspecified: Secondary | ICD-10-CM

## 2021-03-02 DIAGNOSIS — K429 Umbilical hernia without obstruction or gangrene: Secondary | ICD-10-CM

## 2021-03-02 DIAGNOSIS — J302 Other seasonal allergic rhinitis: Secondary | ICD-10-CM

## 2021-03-04 ENCOUNTER — Encounter: Admit: 2021-03-04 | Discharge: 2021-03-04 | Payer: MEDICARE

## 2021-03-04 ENCOUNTER — Inpatient Hospital Stay: Admit: 2021-03-04 | Discharge: 2021-03-04 | Payer: MEDICARE

## 2021-03-04 ENCOUNTER — Inpatient Hospital Stay: Admit: 2021-03-04 | Payer: MEDICARE

## 2021-03-04 VITALS — BP 111/70 | HR 79

## 2021-03-04 VITALS — BP 120/69 | HR 95 | Temp 98.40000°F

## 2021-03-04 VITALS — BP 133/76 | HR 83

## 2021-03-04 VITALS — BP 128/72 | HR 82 | Temp 99.10000°F

## 2021-03-04 VITALS — BP 118/67 | HR 83

## 2021-03-04 VITALS — BP 126/81 | HR 83

## 2021-03-04 VITALS — HR 83

## 2021-03-04 VITALS — BP 119/73 | HR 84 | Ht 67.0 in | Wt 207.0 lb

## 2021-03-04 VITALS — BP 126/77 | HR 80

## 2021-03-04 VITALS — BP 119/63 | HR 82

## 2021-03-04 VITALS — BP 127/70 | HR 88

## 2021-03-04 VITALS — BP 144/73 | HR 86 | Temp 99.30000°F

## 2021-03-04 VITALS — HR 75

## 2021-03-04 VITALS — BP 140/76 | HR 87

## 2021-03-04 VITALS — BP 123/72 | HR 84

## 2021-03-04 VITALS — BP 121/75 | HR 90

## 2021-03-04 VITALS — BP 111/66 | HR 89 | Wt 207.5 lb

## 2021-03-04 VITALS — BP 111/63 | HR 90

## 2021-03-04 VITALS — BP 102/86 | HR 82

## 2021-03-04 DIAGNOSIS — S46002D Unspecified injury of muscle(s) and tendon(s) of the rotator cuff of left shoulder, subsequent encounter: Secondary | ICD-10-CM

## 2021-03-04 DIAGNOSIS — I213 ST elevation (STEMI) myocardial infarction of unspecified site: Principal | ICD-10-CM

## 2021-03-04 DIAGNOSIS — Z8673 Personal history of transient ischemic attack (TIA), and cerebral infarction without residual deficits: Secondary | ICD-10-CM

## 2021-03-04 LAB — CBC AND DIFF
ABSOLUTE BASO COUNT: 0 K/UL (ref 0–0.20)
ABSOLUTE EOS COUNT: 0 K/UL (ref 0–0.45)
ABSOLUTE LYMPH COUNT: 0.2 K/UL — ABNORMAL LOW (ref 1.0–4.8)
ABSOLUTE MONO COUNT: 1 K/UL — ABNORMAL HIGH (ref 0–0.80)
ABSOLUTE NEUTROPHIL: 11 K/UL — ABNORMAL HIGH (ref 1.8–7.0)
BASOPHILS %: 0 % (ref >60)
EOSINOPHILS %: 0 % (ref 0–5)
LYMPHOCYTES %: 2 % — ABNORMAL LOW (ref 24–44)
MONOCYTES %: 8 % (ref 4–12)
MPV: 7.1 FL (ref 7–11)
NEUTROPHILS %: 90 % — ABNORMAL HIGH (ref 41–77)
WBC COUNT: 12 K/UL — ABNORMAL HIGH (ref 4.5–11.0)

## 2021-03-04 LAB — COMPREHENSIVE METABOLIC PANEL: SODIUM: 133 MMOL/L — ABNORMAL LOW (ref 137–147)

## 2021-03-04 LAB — PTT (APTT): PTT: >20 s — ABNORMAL HIGH (ref 24.0–36.5)

## 2021-03-04 LAB — POC ACTIVATED CLOTTING TIME
ACTIVATED CLOTTING: 252 s (ref 0–0.80)
ACTIVATED CLOTTING: 339 s — ABNORMAL HIGH (ref 3–12)
ACTIVATED CLOTTING: 298 s

## 2021-03-04 LAB — HEMOGLOBIN A1C: HEMOGLOBIN A1C: 6 % (ref <150)

## 2021-03-04 LAB — LIPID PROFILE: CHOLESTEROL: 90 mg/dL — ABNORMAL LOW (ref <200)

## 2021-03-04 LAB — MAGNESIUM: MAGNESIUM: 1.5 mg/dL — ABNORMAL LOW (ref 1.6–2.6)

## 2021-03-04 LAB — TROPONIN-I
TROPONIN I: 9.2 ng/mL — ABNORMAL HIGH (ref 0.0–0.05)
TROPONIN I: 7.1 ng/mL — ABNORMAL HIGH (ref 0.0–0.05)

## 2021-03-04 LAB — TSH WITH FREE T4 REFLEX: TSH: 2.1 uU/mL — ABNORMAL LOW (ref 0.35–5.00)

## 2021-03-04 LAB — PROTIME INR (PT): PROTIME: 14 s (ref 9.5–14.2)

## 2021-03-04 LAB — LACTIC ACID(LACTATE): LACTIC ACID: 0.9 MMOL/L — ABNORMAL LOW (ref <100)

## 2021-03-04 LAB — BNP (B-TYPE NATRIURETIC PEPTI): BNP: 405 pg/mL — ABNORMAL HIGH (ref >40)

## 2021-03-04 MED ORDER — IMS MIXTURE TEMPLATE
500 mg | Freq: Every day | ORAL | 0 refills | Status: AC
Start: 2021-03-04 — End: ?
  Administered 2021-03-05 – 2021-03-06 (×4): 500 mg via ORAL

## 2021-03-04 MED ORDER — TICAGRELOR 90 MG PO TAB
90 mg | Freq: Two times a day (BID) | ORAL | 0 refills | Status: AC
Start: 2021-03-04 — End: ?
  Administered 2021-03-05 – 2021-03-06 (×4): 90 mg via ORAL

## 2021-03-04 MED ORDER — HEPARIN (PORCINE) BOLUS FOR CONTINUOUS INF (BAG)
20-40 [IU]/kg | INTRAVENOUS | 0 refills | Status: DC
Start: 2021-03-04 — End: 2021-03-04

## 2021-03-04 MED ORDER — FENTANYL CITRATE (PF) 50 MCG/ML IJ SOLN
25 ug | Freq: Once | INTRAVENOUS | 0 refills | Status: CP
Start: 2021-03-04 — End: ?
  Administered 2021-03-04: 19:00:00 25 ug via INTRAVENOUS

## 2021-03-04 MED ORDER — DIPHENHYDRAMINE HCL 50 MG/ML IJ SOLN
25 mg | INTRAVENOUS | 0 refills | Status: AC | PRN
Start: 2021-03-04 — End: ?

## 2021-03-04 MED ORDER — LIDOCAINE 5 % TP PTMD
1 | Freq: Every day | TOPICAL | 0 refills | Status: DC
Start: 2021-03-04 — End: 2021-03-05

## 2021-03-04 MED ORDER — SODIUM CHLORIDE 0.9 % IV SOLP
500 mL | INTRAVENOUS | 0 refills | Status: DC
Start: 2021-03-04 — End: 2021-03-04

## 2021-03-04 MED ORDER — LIDOCAINE 5 % TP PTMD
1 | Freq: Every day | TOPICAL | 0 refills | Status: AC
Start: 2021-03-04 — End: ?
  Administered 2021-03-05 – 2021-03-06 (×3): 1 via TOPICAL

## 2021-03-04 MED ORDER — ACETAMINOPHEN 325 MG PO TAB
650 mg | ORAL | 0 refills | Status: AC | PRN
Start: 2021-03-04 — End: ?
  Administered 2021-03-05 – 2021-03-06 (×3): 650 mg via ORAL

## 2021-03-04 MED ORDER — NITROGLYCERIN 0.4 MG SL SUBL
.4 mg | SUBLINGUAL | 0 refills | Status: AC | PRN
Start: 2021-03-04 — End: ?

## 2021-03-04 MED ORDER — CHLORTHALIDONE 25 MG PO TAB
12.5 mg | Freq: Every day | ORAL | 0 refills | Status: AC
Start: 2021-03-04 — End: ?
  Administered 2021-03-04: 21:00:00 12.5 mg via ORAL

## 2021-03-04 MED ORDER — MORPHINE 15 MG PO TAB
15 mg | ORAL | 0 refills | Status: DC | PRN
Start: 2021-03-04 — End: 2021-03-04

## 2021-03-04 MED ORDER — ROSUVASTATIN 20 MG PO TAB
20 mg | Freq: Every day | ORAL | 0 refills | Status: AC
Start: 2021-03-04 — End: ?
  Administered 2021-03-04 – 2021-03-06 (×3): 20 mg via ORAL

## 2021-03-04 MED ORDER — ONDANSETRON HCL (PF) 4 MG/2 ML IJ SOLN
4 mg | INTRAVENOUS | 0 refills | Status: AC | PRN
Start: 2021-03-04 — End: ?

## 2021-03-04 MED ORDER — SODIUM CHLORIDE 0.9 % IJ SOLN
50 mL | Freq: Once | INTRAVENOUS | 0 refills | Status: CP
Start: 2021-03-04 — End: ?
  Administered 2021-03-04: 21:00:00 50 mL via INTRAVENOUS

## 2021-03-04 MED ORDER — PERFLUTREN LIPID MICROSPHERES 1.1 MG/ML IV SUSP
1-10 mL | Freq: Once | INTRAVENOUS | 0 refills | Status: CP | PRN
Start: 2021-03-04 — End: ?
  Administered 2021-03-04: 20:00:00 2 mL via INTRAVENOUS

## 2021-03-04 MED ORDER — FUROSEMIDE 10 MG/ML IJ SOLN
40 mg | Freq: Once | INTRAVENOUS | 0 refills | Status: CP
Start: 2021-03-04 — End: ?
  Administered 2021-03-04: 40 mg via INTRAVENOUS

## 2021-03-04 MED ORDER — NITROGLYCERIN 0.4 MG SL SUBL
.4 mg | SUBLINGUAL | 0 refills | Status: DC | PRN
Start: 2021-03-04 — End: 2021-03-04

## 2021-03-04 MED ORDER — IOHEXOL 350 MG IODINE/ML IV SOLN
75 mL | Freq: Once | INTRAVENOUS | 0 refills | Status: CP
Start: 2021-03-04 — End: ?
  Administered 2021-03-04: 21:00:00 75 mL via INTRAVENOUS

## 2021-03-04 MED ORDER — DIPHENHYDRAMINE HCL 25 MG PO CAP
25 mg | ORAL | 0 refills | Status: AC | PRN
Start: 2021-03-04 — End: ?

## 2021-03-04 MED ORDER — COLCHICINE 0.6 MG PO TAB
0.6 mg | Freq: Every day | ORAL | 0 refills | Status: AC
Start: 2021-03-04 — End: ?
  Administered 2021-03-04 – 2021-03-06 (×3): 0.6 mg via ORAL

## 2021-03-04 MED ORDER — MORPHINE 15 MG PO TAB
7.5-15 mg | ORAL | 0 refills | Status: AC | PRN
Start: 2021-03-04 — End: ?
  Administered 2021-03-05 – 2021-03-06 (×7): 15 mg via ORAL

## 2021-03-04 MED ORDER — HEPARIN (PORCINE) IN 5 % DEX 20,000 UNIT/500 ML (40 UNIT/ML) IV SOLP
0-2000 [IU]/h | INTRAVENOUS | 0 refills | Status: DC
Start: 2021-03-04 — End: 2021-03-04

## 2021-03-04 MED ORDER — MORPHINE 2 MG/ML IV SYRG
2-4 mg | INTRAVENOUS | 0 refills | Status: DC | PRN
Start: 2021-03-04 — End: 2021-03-04

## 2021-03-04 MED ORDER — POLYETHYLENE GLYCOL 3350 17 GRAM PO PWPK
1 | Freq: Every day | ORAL | 0 refills | Status: AC
Start: 2021-03-04 — End: ?
  Administered 2021-03-05: 14:00:00 17 g via ORAL

## 2021-03-04 MED ORDER — SODIUM CHLORIDE 0.9 % IV SOLP
250 mL | INTRAVENOUS | 0 refills | Status: DC
Start: 2021-03-04 — End: 2021-03-04

## 2021-03-04 MED ORDER — HEPARIN (PORCINE) INITIAL BOLUS FOR CONTINUOUS INF (BAG)
60 [IU]/kg | Freq: Once | INTRAVENOUS | 0 refills | Status: DC
Start: 2021-03-04 — End: 2021-03-04

## 2021-03-04 MED ORDER — SENNOSIDES-DOCUSATE SODIUM 8.6-50 MG PO TAB
1 | Freq: Two times a day (BID) | ORAL | 0 refills | Status: AC
Start: 2021-03-04 — End: ?
  Administered 2021-03-05 – 2021-03-06 (×4): 1 via ORAL

## 2021-03-04 MED ORDER — ASPIRIN 81 MG PO CHEW
81 mg | Freq: Every day | ORAL | 0 refills | Status: AC
Start: 2021-03-04 — End: ?
  Administered 2021-03-05 – 2021-03-06 (×2): 81 mg via ORAL

## 2021-03-04 MED ORDER — ASPIRIN 325 MG PO TAB
325 mg | Freq: Once | ORAL | 0 refills | Status: DC
Start: 2021-03-04 — End: 2021-03-04

## 2021-03-04 MED ORDER — ALLOPURINOL 300 MG PO TAB
300 mg | Freq: Every day | ORAL | 0 refills | Status: DC
Start: 2021-03-04 — End: 2021-03-04

## 2021-03-04 MED ORDER — METHOCARBAMOL 500 MG PO TAB
500 mg | Freq: Once | ORAL | 0 refills | Status: CP
Start: 2021-03-04 — End: ?
  Administered 2021-03-05: 04:00:00 500 mg via ORAL

## 2021-03-04 NOTE — Telephone Encounter
Ronnie Williams, patient's wife stated patient started having chest pain at 3:00 a, and about 6:30 they went to the [facility] where they said Rocky Link had a heart attack and a possible PE. Patient's wife reported they start Rocky Link on heprin and they were en rout to [facility] hospital by ambulance.  Ken underwent right shoulder arthroscopy, rotator cuff repair, distal clavicle excision, biceps tenotomy with Dr. Royann Shivers on 02/28/21. RN notified Dr. Royann Shivers. Questions answered, instructed patient to call [phone] with any other questions or concerns.

## 2021-03-04 NOTE — Consults
Cardiothoracic Surgery Consult  Date of Service: 03/04/2021    Requesting Physician: Lily Lovings   Consulting Physician: Dr. Elvis Coil   Consult Performed By:  Baxter Flattery, PA-C  Primary Cardiologist:    HPI:             Ronnie Williams is 65 year old male with pmh of HTN, HLD, previous CVA, colon resection d/t diverticulitis, OSA and CAD who presented to OSH with complaints of severe sudden chest pain that awoke him from sleep at 3am. OSH told the patient he had a heart attack and he was placed on a heparin drip. His troponin was 17.5. He was transferred to [facility] for further medical management. He underwent left heart catheterization which showed 99% lesion to circ, which a DES stent was placed. In addition, a LAD lesion of 70-80% which was unable to be stented. Of note, the patient underwent right shoulder arthroscopy, rotator cuff repair,?distal clavicle excision, biceps tenotomy with Dr. Royann Shivers on 02/28/21.    The patient's physical activity has been limited lately due to his recent shoulder surgery. 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 He has a significant history of cerebral aneurysm and strokes, most recently in 06/2020. He denies any residual neuro or motor deficits from CVA. He follows with neurology at [facility]. He is a never smoker and drinks recreationally 3 beers/week. The patient has OSA and intermittently uses CPAP at night. Carotid US from 08/17 shows chronic total occlusion of L ICA and <50% stenosis of R ICA. He denies any hx of chest pain, SOB, kidney disease, or other associated symptoms.     Cardiothoracic consultation has been requested to determine if the patient is a surgical candidate for coronary artery bypass grafting.     Cardiac presentation on admission: NSTEMI    Impression:  There are no active hospital problems to display for this patient.       Plan:  -review records from OSH. consulted to evaluate for possible CABG, possible LIMA to LAD. Will discuss with staff about surgical candidacy.   -current medications include Brilinta, recommend transition to Cangrelor if plans for surgery. Can cont heparin drip and ASA. Cont care per primary team.   -ordered vein mapping and pfts. Carotids completed 08/22. Echocardiogram, BNP, Hgb A1c pending. Monitor trending trops.    -please contact with any questions or concerns       Baxter Flattery, PA-C  Reach me on Voalte or Pager   at 03/04/2021 12:20 PM     Preliminary STS RISK:     Risk of Mortality:  1.549%  Renal Failure:  1.707%  Permanent Stroke:  2.073%  Prolonged Ventilation:  8.188%  DSW Infection:  0.172%  Reoperation:  2.222%  Morbidity or Mortality:  13.520%  Short Length of Stay:  45.958%  Long Length of Stay:  4.287%    LEFT HEART CATH FINDINGS:    99% circ which was the culprit lesion. Got an Onyx   Frontier DES. Has a 70-80% ostial LAD and 70% mid LAD. We performed a   RFR across it. The RFR was 0.63. Upon pullback, we found that both      ECHOCARDIOGRAM Findings:    Carotid US 01/01/2021:    IMPRESSION   Right: <50% stenosis of the internal carotid artery utilizing velocity   criteria. Plaque Morphology: Heterogeneous plaque was imaged in the bulb   and internal carotid artery.  Antegrade flow in the vertebral artery.   Compared to the previous outside CT on 08/21/2020, the internal carotid   artery was patent without stenosis.   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 Antegrade   flow in the vertebral artery. Compared to the previous outside CT on   08/21/2020, there was chronic total occlusion of the internal carotid   artery.         BNP: No results found for: BNP    NYHA: II    Medical History:   Diagnosis Date   ? Arthritis    ? Carotid artery disease (HCC)    ? Cerebral aneurysm    ? Diverticulitis    ? High cholesterol    ? Hypertension    ? OSA on CPAP    ? Seasonal allergic reaction    ? Stroke [facility]) 2014   ? Stroke [facility]) 06/2020   ? Umbilical hernia        Surgical History:   Procedure Laterality Date   ? HX EAR TUBES  02/25/2016   ? right shoulder arthroscopy, rotator cuff repair Right 02/28/2021    Performed by Dolan Amen, MD at [facility]   ? right shoulder arthroscopy,  distal clavicle excision Right 02/28/2021    Performed by Dolan Amen, MD at [facility]   ? right shoulder arthroscopy,  biceps tenotomy Right 02/28/2021    Performed by Dolan Amen, MD at [facility]   ? COLECTOMY      colon resection   ? HX ADENOIDECTOMY     ? HX TONSILLECTOMY          No medications prior to admission.        Allergies   Allergen Reactions   ? Doxycycline MUSCLE PAIN   ? Latex ITCHING, SEE COMMENTS and REDNESS     With prolonged exposure   ? Levaquin [Levofloxacin] SEE COMMENTS     Body aches and shakes   ? Plavix [Clopidogrel] SEE COMMENTS     Joint pain and muscle pain   ? Sulfa (Sulfonamide Antibiotics) ITCHING       No family history on file.    Social History     Socioeconomic History   ? Marital status: Married   Tobacco Use   ? Smoking status: Never Smoker   ? Smokeless tobacco: Never Used   Substance and Sexual Activity   ? Alcohol use: Yes     Alcohol/week: 1.0 standard drink     Types: 1 Cans of beer per week     Comment: beer/wine 3 drinks/week.   ? Drug use: No       ROS:  Constitutional: Negative for Fatigue, Weight Change, Fevers  Eyes, Ears, Nose And Throat: Negative for Change in vision, Change in Hearing   Cardiovascular: Negative for +Chest Pain, Palpitations, Swelling in ankles  Respiratory: Negative for Cough, Shortness of Breath, wheezing  Gastrointestinal: Negative for Nausea, indigestion, Diarrhea, Constipation, Rectal Bleeding   Neurological: Negative for Headache, Memory problems, Numbness, Muscle Weakness  Psychological: Negative for depression or anxiety  Musculoskeletal: Negative for Pain or Swelling in joints, +Right shoulder in immobilizer from recent surgery.   Genitourinary: Negative for Pain with urination, Incontinence of urine, urinary frequency    Skin: Negative for any unusual rash  Endocrine: Negative for any hair, skin, or nail changes, Unusual hunger or thirst    Physical Exam:         GENERAL:   A&O x  3, NAD, appears stated age  HEENT:  Head is normocephalic,atraumatic  Eyes are normal, pupils size normal and round/reactive  Mouth appears normal with no lesions, teeth present and in good repair  NECK:  No masses, normal ROM, trachea midline  CARDIOVASCULAR:  No JVD ,No bruits  RRR, no rub, no murmur  No lower extremity edema, pulses are equal bilaterally (AT, PT, Radial)  LUNGS:  Clear to auscultation bilaterally, chest symmetrical with respirations  ABDOMEN:  Soft, NT, + BS  SKIN:   No lesions, rashes or wounds  MUSCULOSKELETAL:  No muscle atrophy, normal joint range of motion. +right shoulder in immobilizer   NEUROLOGIC:  Grossly intact     No results found for this visit on 03/04/21 (from the past 24 hour(s)).

## 2021-03-04 NOTE — Progress Notes
65 yo male Pmhx of HTN, high cholesterol, CVA, colon resection d/t diverticulitis, OSA, CAD.  Pt  Woke at 3am with chest pain radiates to back  Had shallow respirations due to pleuritic chest pain from recent surgery for right rotator cuff with [facility]  Pt Sp02 89% on RA on initial arrival has been placed on 2 lpm NC  Pain left substernal radiates to back    13.5  41.9  Plt 196  K 3.9  Cr 1.06  Na 137  D-dimer 1400  Heparin 4000 units bolus and drip at 1000 units/hr  ASA 81mg  x4  Fentanyl  0.5mg  Morphine  5 Metoprolol at 0855    164/87  96  41   93% on 2 lpm    NPO?   Yes  Right shoulder immobilizer still in place

## 2021-03-04 NOTE — Progress Notes
I saw this patient today upon admission to the CICU. I have reviewed all pertinent data, including labs, operative reports, radiology, vital signs, neurologic assessments, nursing notes, respiratory therapy data, intake and output, and consult notes and have devised a plan based on the aforementioned data. This patient is critically ill and is at risk for life threatening deterioration necessitating complex medical decision making and ongoing provision of ICU care for close monitoring.      Critical Care Time: 52 minutes  Date of Service: 03/04/21    Brief Patient Summary:  39M with HTN, HLD, previous CVA (2016 and 2022), colon resection d/t diverticulitis, OSA and CAD who presented to OSH with complaints of severe sudden chest pain that awoke him from sleep at 3am. OSH told the patient he "had a heart attack" and he was placed on a heparin drip. His troponin was 17.5. He was transferred to [facility] for further medical management. He underwent left heart catheterization which showed 99% lesion to circ, which a DES stent was placed. In addition, a LAD lesion of 70-80% which was unable to be stented. Of note, the patient underwent right shoulder arthroscopy, rotator cuff repair,distal clavicle excision,biceps tenotomywith Dr. Royann Shivers on 02/28/21.    Neuro:  PRN pain medications   Delirium precautions    CV:  STEMI  -PCI performed as above  -DAPT with ASA and Brilinta   -CTS consulted for possible LAD intervention in the future   -Echo pending    Pulmonary:  Hypoxia   -Pleuritic chest pain post intervention   -Requiring 3L NC, has had limited activity over the last week due to shoulder surgery   -Will obtain CT PE protocol   Pulmonary toilet     Renal:  I&O    ID:  No infectious concerns     GI:  Cardiac diet  Bowel reg     Heme:  Monitor Hgb and transfuse <7    Endocrine:  Stress hyperglycemia  -Glucose checks   -SSI

## 2021-03-04 NOTE — Care Plan
Problem: Discharge Planning  Goal: Participation in plan of care  Outcome: Goal Ongoing  Goal: Knowledge regarding plan of care  Outcome: Goal Ongoing  Goal: Prepared for discharge  Outcome: Goal Ongoing     Problem: Skin Integrity  Goal: Skin integrity intact  Outcome: Goal Ongoing  Goal: Healing of skin (Wound & Incision)  Outcome: Goal Ongoing  Goal: Healing of skin (Pressure Injury)  Outcome: Goal Ongoing     Problem: Pain  Goal: Management of pain  Outcome: Goal Ongoing  Goal: Knowledge of pain management  Outcome: Goal Ongoing  Goal: Progress Toward Pain Management Goals  Outcome: Goal Ongoing

## 2021-03-04 NOTE — Progress Notes
RT Adult Assessment Note    NAME:Ronnie Williams             MRN: [id number]             DOB:February 09, 1956          AGE: 65 y.o.  ADMISSION DATE: 03/04/2021             DAYS ADMITTED: LOS: 0 days    RT Treatment Plan:       Protocol Plan: Procedures  Oxygen/Humidity: O2 to keep SpO2 > 92%, if on room air for > 24 hours and no other RT modalities are required, then D/C protocol  CPAP/BiPAP: CPAP  SpO2: Continuous (Document SpO2 result Qshift)    Additional Comments:  Impressions of the patient: Resting in bed.  Intervention(s)/outcome(s): Eval, OSA will wear hospital CPAP tonight.  Patient education that was completed:   Recommendations to the care team: Continue to monitor. Titrate o2.    Vital Signs:  Pulse: 87  RR: (!) 33 PER MINUTE  SpO2: 95 %  O2 Device: Nasal cannula  Liter Flow: 1 Lpm  O2%:    Breath Sounds: Clear (Implies normal);Decreased  Respiratory Effort: Labored;Shallow

## 2021-03-04 NOTE — H&P (View-Only)
General Medicine Service  Admission History and Physical Examination      Name:  Ronnie Williams                                             MRN:  [id number]   Admission Date:  03/04/2021                     Assessment/Plan:   Principal Problem:    STEMI (ST elevation myocardial infarction) (HCC)      [name] is a 65 y.o. male with PMHx HTN, HLD, OSA, CAD presented as a transfer from OSH / stemi. S/p DES x1 to LCX. Patient with significant LAD lesion was unable to be stented, so CTS was consulted for operative intervention.    #Acute STEMI  #HTN  #HLD  #CAD  #Chest pain  -At OSH, reports of pleuritic chest pain, new O2 requirement, recent immobilization and D-dimer of ~1500  -OSH Troponin of 19  -S/p DES x1 to LCX - 99% lesion to circ, DES placed. LAD lesion of 70-80% which was unable to be stented.   Plan  -Labs ordered  -Trend trops  -ASA, Brillinta  -PTA rosuva   -A1c, Lipids  -CTS consult  -Echo, ordered  -GDMT per echo results   -CTA chest to eval for PE  -Will plan on starting metoprolol after PE w/u if negative  -Holding ACE for CTS eval    Hx of Gout  -PTA colchicine, allopurinol    Hx of shoulder operation  -5 days ago  -Recent Immobility  -CTM  -PE w/u as above    FEN: IVF prn, electrolytes prn, Diet NPO  Advance Diet as Tolerated   PPX: Heparin  Code Status: Full Code  Consults: CTS    Disposition: Admit to Medicine      Patient seen and discussed with Dr. Oswaldo Conroy, MD  Emergency Medicine PGY-2  __________________________________________________________________________________      Chief Complaint:  Chest pain    History of Present Illness:     Ronnie Williams is 65 year old male with pmh of HTN, HLD, previous CVA, colon resection d/t diverticulitis, OSA and CAD who presented to OSH with complaints of severe sudden chest pain that awoke him from sleep at 3am. OSH told the patient he had a heart attack and he was placed on a heparin drip. His troponin was 17.5. He was transferred to [facility] for further medical management. He underwent left heart catheterization which showed 99% lesion to circ, which a DES stent was placed. In addition, a LAD lesion of 70-80% which was unable to be stented. Of note, the patient underwent right shoulder arthroscopy, rotator cuff repair,?distal clavicle excision,?biceps tenotomy?with Dr. Royann Shivers on 02/28/21.  ?  The patient's physical activity has been limited lately due to his recent shoulder surgery. 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 He has a significant history of cerebral aneurysm and strokes, most recently in 06/2020. He denies any residual neuro or motor deficits from CVA. He follows with neurology at [facility]. He is a never smoker and drinks recreationally 3 beers/week. The patient has OSA and intermittently uses CPAP at night. Carotid US from 08/17 shows chronic total occlusion of L ICA and <50% stenosis of R  ICA. He denies any hx of chest pain, SOB, kidney disease, or other associated symptoms.     Past Medical History     Medical History:   Diagnosis Date   ? Arthritis    ? Carotid artery disease (HCC)    ? Cerebral aneurysm    ? Diverticulitis    ? High cholesterol    ? Hypertension    ? OSA on CPAP    ? Seasonal allergic reaction    ? Stroke [facility]) 2014   ? Stroke [facility]) 06/2020   ? Umbilical hernia        Past Surgical History     Surgical History:   Procedure Laterality Date   ? HX EAR TUBES  02/25/2016   ? right shoulder arthroscopy, rotator cuff repair Right 02/28/2021    Performed by Dolan Amen, MD at [facility]   ? right shoulder arthroscopy,  distal clavicle excision Right 02/28/2021    Performed by Dolan Amen, MD at [facility]   ? right shoulder arthroscopy,  biceps tenotomy Right 02/28/2021    Performed by Dolan Amen, MD at [facility]   ? COLECTOMY      colon resection   ? HX ADENOIDECTOMY     ? HX TONSILLECTOMY         Family History   No family history on file.    Social History     Social History     Socioeconomic History   ? Marital status: Married   Tobacco Use   ? Smoking status: Never Smoker   ? Smokeless tobacco: Never Used   Substance and Sexual Activity   ? Alcohol use: Yes     Alcohol/week: 1.0 standard drink     Types: 1 Cans of beer per week     Comment: beer/wine 3 drinks/week.   ? Drug use: No        Immunizations (includes history and patient reported):   Immunization History   Administered Date(s) Administered   ? COVID-19 (MODERNA), mRNA vacc, 100 mcg/0.5 mL (PF) 08/08/2019, 09/05/2019   ? Flu Vaccine =>6 Months Quadrivalent PF 03/01/2021           Allergies:  Doxycycline, Latex, Levaquin [levofloxacin], Plavix [clopidogrel], and Sulfa (sulfonamide antibiotics)    Medications Prior to Admission:  Prior to Admission Medications   Prescriptions Last Dose Informant Patient Reported? Taking?   MEN'S MULTI-VITAMIN PO  Self Yes No   Sig: Take 1 tablet by mouth daily.   Potassium Gluconate 595 mg (99 mg) tab  Self Yes No   Sig: Take 595 mg by mouth daily.   allopurinoL (ZYLOPRIM) 100 mg tablet  Self Yes No   Sig: Take 200 mg by mouth at bedtime daily. Take with food.   allopurinoL (ZYLOPRIM) 300 mg tablet  Self Yes No   Sig: Take 300 mg by mouth daily. Take with food.   aspirin EC 81 mg tablet  Self Yes No   Sig: Take 81 mg by mouth daily. Take with food.   chlorthalidone (HYGROTON) 25 mg tablet  Self Yes No   Sig: Take 12.5 mg by mouth every morning.   coQ10 (ubiquinol) 100 mg cap  Self Yes No   Sig: Take 1 capsule by mouth at bedtime daily.   fexofenadine (ALLEGRA) 180 mg tablet  Self Yes No   Sig: Take 180 mg by mouth daily.   glucosamine-D3-Boswellia serr 1,500-400-100 mg-unit-mg tab  Self Yes No   Sig: Take  1 tablet by mouth daily.   olmesartan (BENICAR) 40 mg tablet  Self Yes No   Sig: Take 40 mg by mouth daily.   oxyCODONE/acetaminophen (PERCOCET) 10/325 mg tablet   No No   Sig: Take one tablet to two tablets by mouth every 4 hours as needed for Pain.  Max 8 tabs/day   rosuvastatin (CRESTOR) 20 mg tablet  Self Yes No   Sig: Take 20 mg by mouth at bedtime daily.   sodium chloride (SEA MIST) 0.65 % nasal spray  Self Yes No   Sig: Apply 2 sprays to each nostril as directed as Needed.   sour cherry extract (TART CHERRY EXTRACT) 1,000 mg cap  Self Yes No   Sig: Take 1 capsule by mouth daily.   triamcinolone (NASACORT) 55 mcg nasal inhaler  Self Yes No   Sig: Apply 2 sprays to each nostril as directed daily.      Facility-Administered Medications: None       Review of Systems     14-point ROS performed and negative except for:  Chest pain    Physical Exam   Vital signs:  BP: 133/76 (10/18 1330)  Temp: 36.9 ?C (98.4 ?F) (10/18 1221)  Pulse: 83 (10/18 1339)  Respirations: 18 PER MINUTE (10/18 1339)  SpO2: 96 % (10/18 1339)  SpO2 Pulse: 89 (10/18 1300)  BP Readings from Last 6 Encounters:   03/04/21 133/76   03/01/21 118/75   02/11/21 135/75   02/11/21 135/74   01/01/21 (!) 150/71   05/08/20 (!) 159/82     Wt Readings from Last 3 Encounters:   03/04/21 94.1 kg (207 lb 7.3 oz)   02/28/21 94.1 kg (207 lb 7.3 oz)   02/11/21 93.1 kg (205 lb 3.2 oz)       General: 65 y.o. male, cooperative, no distress, appears stated age  Head: normocephalic, atraumatic  Eyes: Conjunctivae/corneas clear. PERRL, EOMs intact.  ENT: No gross deformities. Mucous membranes moist. Pharynx non-erythematous  Neck: Supple, symmetrical, trachea midline, no adenopathy, thyroid not enlarged, no carotid bruit, no JVD  Back: Symmetric, no curvature, ROM normal.  No CVA tenderness.  Lungs: Clear to auscultation bilaterally; no wheezes, rales, or rhonchi  Heart: Regular rate and rhythm; no murmurs, rubs, or gallops  Abdomen: Soft, non-tender. Bowel sounds normal. No masses. No organomegaly.  Extremities: Pulses present, trace lower extremity edema bilaterally  Skin: warm, dry. No rashes or lesions  Neurologic: Alert, oriented x 3. Strength and sensation grossly conserved      Lab/Radiology/Other Diagnostic Tests:  24-hour labs:    Results for orders placed or performed during the hospital encounter of 03/04/21 (from the past 24 hour(s))   TROPONIN-I    Collection Time: 03/04/21 12:28 PM   Result Value Ref Range    Troponin-I 9.25 (H) 0.0 - 0.05 NG/ML   COMPREHENSIVE METABOLIC PANEL    Collection Time: 03/04/21 12:28 PM   Result Value Ref Range    Sodium 133 (L) 137 - 147 MMOL/L    Potassium 4.6 3.5 - 5.1 MMOL/L    Chloride 97 (L) 98 - 110 MMOL/L    Glucose 126 (H) 70 - 100 MG/DL    Blood Urea Nitrogen 18 7 - 25 MG/DL    Creatinine 1.61 0.4 - 1.24 MG/DL    Calcium 8.6 8.5 - 09.6 MG/DL    Total Protein 5.8 (L) 6.0 - 8.0 G/DL    Total Bilirubin 1.2 0.3 - 1.2 MG/DL    Albumin 3.5 3.5 -  5.0 G/DL    Alk Phosphatase 63 25 - 110 U/L    AST (SGOT) 42 (H) 7 - 40 U/L    CO2 26 21 - 30 MMOL/L    ALT (SGPT) 18 7 - 56 U/L    Anion Gap 10 3 - 12    eGFR >60 >60 mL/min   CBC AND DIFF    Collection Time: 03/04/21 12:28 PM   Result Value Ref Range    White Blood Cells 12.8 (H) 4.5 - 11.0 K/UL    RBC 3.48 (L) 4.4 - 5.5 M/UL    Hemoglobin 11.5 (L) 13.5 - 16.5 GM/DL    Hematocrit 16.1 (L) 40 - 50 %    MCV 96.5 80 - 100 FL    MCH 33.0 26 - 34 PG    MCHC 34.2 32.0 - 36.0 G/DL    RDW 09.6 11 - 15 %    Platelet Count 185 150 - 400 K/UL    MPV 7.1 7 - 11 FL    Neutrophils 90 (H) 41 - 77 %    Lymphocytes 2 (L) 24 - 44 %    Monocytes 8 4 - 12 %    Eosinophils 0 0 - 5 %    Basophils 0 0 - 2 %    Absolute Neutrophil Count 11.51 (H) 1.8 - 7.0 K/UL    Absolute Lymph Count 0.20 (L) 1.0 - 4.8 K/UL    Absolute Monocyte Count 1.07 (H) 0 - 0.80 K/UL    Absolute Eosinophil Count 0.01 0 - 0.45 K/UL    Absolute Basophil Count 0.03 0 - 0.20 K/UL   MAGNESIUM    Collection Time: 03/04/21 12:28 PM   Result Value Ref Range    Magnesium 1.5 (L) 1.6 - 2.6 mg/dL   LIPID PROFILE    Collection Time: 03/04/21 12:28 PM   Result Value Ref Range    Cholesterol 90 <200 MG/DL    Triglycerides 45 <045 MG/DL    HDL 42 >40 MG/DL LDL 43 <981 mg/dL    VLDL 9 MG/DL    Non HDL Cholesterol 48 MG/DL   BNP (B-TYPE NATRIURETIC PEPTI)    Collection Time: 03/04/21 12:28 PM   Result Value Ref Range    B Type Natriuretic Peptide 405.0 (H) 0 - 100 PG/ML   LACTIC ACID(LACTATE)    Collection Time: 03/04/21 12:28 PM   Result Value Ref Range    Lactic Acid 0.9 0.5 - 2.0 MMOL/L   PROTIME INR (PT)    Collection Time: 03/04/21 12:28 PM   Result Value Ref Range    Protime 14.1 9.5 - 14.2 SEC    INR 1.2 0.8 - 1.2   PTT (APTT)    Collection Time: 03/04/21 12:28 PM   Result Value Ref Range    APTT >200.0 (HH) 24.0 - 36.5 SEC     Glucose: (!) 126 (03/04/21 1228)  Pertinent radiology personally viewed.    No results found.

## 2021-03-04 NOTE — Progress Notes
RT Adult Assessment Note    NAME:Ronnie Williams             MRN: [id number]             DOB:07/22/1955          AGE: 65 y.o.  ADMISSION DATE: 03/04/2021             DAYS ADMITTED: LOS: 0 days    Bedside PFT attempted. Poor effort due to pain and LOC. Patient reports that he wont be able to take a deep breath.     Will Merlinda Frederick, RT  Available on Voalte

## 2021-03-04 NOTE — Progress Notes
1600: Patient was taken by me to CT  1640: Patient and I returned to the floor

## 2021-03-04 NOTE — Progress Notes
Medicare is listed as patient's primary insurance coverage.  Pre-certification is not required for hospitalizations.

## 2021-03-05 VITALS — BP 95/53 | HR 70 | Temp 98.80000°F

## 2021-03-05 VITALS — BP 99/60 | HR 69

## 2021-03-05 VITALS — BP 107/58 | HR 75

## 2021-03-05 VITALS — BP 116/62 | HR 71

## 2021-03-05 VITALS — BP 102/66 | HR 76

## 2021-03-05 VITALS — BP 93/56 | HR 70

## 2021-03-05 VITALS — HR 74 | Temp 97.30000°F

## 2021-03-05 VITALS — BP 94/54 | HR 70 | Wt 206.6 lb

## 2021-03-05 VITALS — BP 122/72 | HR 67 | Temp 98.30000°F

## 2021-03-05 VITALS — HR 74

## 2021-03-05 VITALS — BP 103/57 | HR 70 | Temp 98.80000°F

## 2021-03-05 VITALS — BP 99/58 | HR 75

## 2021-03-05 VITALS — BP 107/61 | HR 69 | Temp 97.40000°F

## 2021-03-05 VITALS — BP 103/54 | HR 69 | Temp 97.70000°F

## 2021-03-05 VITALS — BP 101/57 | HR 70

## 2021-03-05 VITALS — BP 131/76 | HR 66

## 2021-03-05 VITALS — BP 95/58 | HR 68

## 2021-03-05 VITALS — BP 98/59 | HR 74

## 2021-03-05 VITALS — HR 61

## 2021-03-05 VITALS — BP 102/60 | HR 68

## 2021-03-05 VITALS — BP 134/70 | HR 67

## 2021-03-05 VITALS — BP 118/86 | HR 71

## 2021-03-05 MED ADMIN — HEPARIN, PORCINE (PF) 5,000 UNIT/0.5 ML IJ SYRG [95535]: 5000 [IU] | SUBCUTANEOUS | @ 18:00:00 | NDC 00409131611

## 2021-03-05 MED ADMIN — POTASSIUM CHLORIDE 20 MEQ PO TBTQ [35943]: 20 meq | ORAL | @ 09:00:00 | Stop: 2021-03-05 | NDC 00832532511

## 2021-03-05 MED ADMIN — MAGNESIUM SULFATE IN D5W 1 GRAM/100 ML IV PGBK [166578]: 1 g | INTRAVENOUS | @ 09:00:00 | Stop: 2021-03-05 | NDC 00338170940

## 2021-03-05 MED ADMIN — METOPROLOL TARTRATE 25 MG PO TAB [37637]: 12.5 mg | ORAL | @ 16:00:00 | NDC 62584026511

## 2021-03-05 MED ADMIN — FUROSEMIDE 10 MG/ML IJ SOLN [3291]: 40 mg | INTRAVENOUS | @ 14:00:00 | Stop: 2021-03-05 | NDC 70860030242

## 2021-03-05 MED ADMIN — MAGNESIUM SULFATE IN D5W 1 GRAM/100 ML IV PGBK [166578]: 1 g | INTRAVENOUS | @ 10:00:00 | Stop: 2021-03-05 | NDC 00409672723

## 2021-03-05 MED ADMIN — MAGNESIUM SULFATE IN D5W 1 GRAM/100 ML IV PGBK [166578]: 1 g | INTRAVENOUS | @ 11:00:00 | Stop: 2021-03-05 | NDC 00409672723

## 2021-03-06 ENCOUNTER — Encounter: Admit: 2021-03-06 | Discharge: 2021-03-06 | Payer: MEDICARE

## 2021-03-06 VITALS — BP 84/60 | HR 74

## 2021-03-06 VITALS — BP 133/74 | HR 64 | Temp 97.90000°F

## 2021-03-06 VITALS — BP 127/71 | HR 97 | Temp 98.00000°F

## 2021-03-06 VITALS — BP 111/78 | HR 68 | Temp 97.50000°F

## 2021-03-06 DIAGNOSIS — I2121 ST elevation (STEMI) myocardial infarction involving left circumflex coronary artery: Secondary | ICD-10-CM

## 2021-03-06 DIAGNOSIS — I2510 Atherosclerotic heart disease of native coronary artery without angina pectoris: Secondary | ICD-10-CM

## 2021-03-06 MED ADMIN — HEPARIN, PORCINE (PF) 5,000 UNIT/0.5 ML IJ SYRG [95535]: 5000 [IU] | SUBCUTANEOUS | @ 12:00:00 | Stop: 2021-03-06 | NDC 00409131611

## 2021-03-06 MED ADMIN — HEPARIN, PORCINE (PF) 5,000 UNIT/0.5 ML IJ SYRG [95535]: 5000 [IU] | SUBCUTANEOUS | @ 03:00:00 | NDC 00409131611

## 2021-03-06 MED ADMIN — METOPROLOL TARTRATE 25 MG PO TAB [37637]: 12.5 mg | ORAL | @ 02:00:00 | NDC 62584026511

## 2021-03-06 MED ADMIN — POTASSIUM CHLORIDE 10 MEQ PO TBTQ [35942]: 10 meq | ORAL | @ 14:00:00 | Stop: 2021-03-06 | NDC 00245531789

## 2021-03-06 MED ADMIN — METOPROLOL TARTRATE 25 MG PO TAB [37637]: 12.5 mg | ORAL | @ 14:00:00 | Stop: 2021-03-06 | NDC 62584026511

## 2021-03-06 MED FILL — METOPROLOL TARTRATE 25 MG PO TAB: 25 mg | ORAL | 30 days supply | Qty: 30 | Fill #1 | Status: CP

## 2021-03-06 MED FILL — COLCHICINE 0.6 MG PO TAB: 0.6 mg | ORAL | 30 days supply | Qty: 30 | Fill #1 | Status: CP

## 2021-03-06 MED FILL — TICAGRELOR 90 MG PO TAB: 90 mg | ORAL | 30 days supply | Qty: 60 | Fill #1 | Status: CP

## 2021-03-06 MED FILL — NITROGLYCERIN 0.4 MG SL SUBL: 0.4 mg | SUBLINGUAL | 9 days supply | Qty: 25 | Fill #1 | Status: CP

## 2021-03-06 MED FILL — FUROSEMIDE 20 MG PO TAB: 20 mg | ORAL | 30 days supply | Qty: 30 | Fill #1 | Status: CP

## 2021-03-10 ENCOUNTER — Encounter: Admit: 2021-03-10 | Discharge: 2021-03-10 | Payer: MEDICARE

## 2021-03-10 NOTE — Telephone Encounter
03-10-21  Internal referral.  No DT info.  Previously seen by CVM.  No records requested.  tlv

## 2021-03-11 ENCOUNTER — Encounter: Admit: 2021-03-11 | Discharge: 2021-03-11 | Payer: MEDICARE

## 2021-03-11 DIAGNOSIS — M75101 Unspecified rotator cuff tear or rupture of right shoulder, not specified as traumatic: Secondary | ICD-10-CM

## 2021-03-12 ENCOUNTER — Encounter: Admit: 2021-03-12 | Discharge: 2021-03-12 | Payer: MEDICARE

## 2021-03-12 ENCOUNTER — Ambulatory Visit: Admit: 2021-03-12 | Discharge: 2021-03-12 | Payer: MEDICARE

## 2021-03-12 DIAGNOSIS — I779 Disorder of arteries and arterioles, unspecified: Secondary | ICD-10-CM

## 2021-03-12 DIAGNOSIS — J302 Other seasonal allergic rhinitis: Secondary | ICD-10-CM

## 2021-03-12 DIAGNOSIS — K5792 Diverticulitis of intestine, part unspecified, without perforation or abscess without bleeding: Secondary | ICD-10-CM

## 2021-03-12 DIAGNOSIS — I10 Essential (primary) hypertension: Secondary | ICD-10-CM

## 2021-03-12 DIAGNOSIS — E7800 Pure hypercholesterolemia, unspecified: Secondary | ICD-10-CM

## 2021-03-12 DIAGNOSIS — K429 Umbilical hernia without obstruction or gangrene: Secondary | ICD-10-CM

## 2021-03-12 DIAGNOSIS — I671 Cerebral aneurysm, nonruptured: Secondary | ICD-10-CM

## 2021-03-12 DIAGNOSIS — M1990 Unspecified osteoarthritis, unspecified site: Secondary | ICD-10-CM

## 2021-03-12 DIAGNOSIS — G4733 Obstructive sleep apnea (adult) (pediatric): Secondary | ICD-10-CM

## 2021-03-12 DIAGNOSIS — I639 Cerebral infarction, unspecified: Secondary | ICD-10-CM

## 2021-03-12 NOTE — Patient Instructions
J. Paul Schroeppel, MD  Seth Morgan, PA-C  [facility] - Phone [phone] - Fax [phone]   [address], [address] - [place], [place] [address]  Vyncent Overby, RN - Clinical Nurse Coordinator  Drew Hutchison, ATC - Clinical Athletic Trainer    Please call [phone] for follow up appointments For up to date information on the COVID-19 virus, visit the CDC website. https://www.cdc.gov/coronavirus   General supportive care during cold and flu season and infection prevention reminders:    o Wash hands often with soap and water for at least 20 seconds   o Cover your mouth and nose   o Social distancing: try to maintain 6 feet between you and other people   o Stay home if sick and symptoms mild or manageable?  If you must be around people wear a mask     If you are having symptoms of a lower respiratory infection (cough, shortness of breath) and/or fever AND either traveled in last 30 days (internationally or to region of exposure) OR known exposure to patient with COVID19:     o Call your primary care provider for questions or health needs.   Tell your doctor about your recent travel and your symptoms     o In a medical emergency, call 911 or go to the nearest emergency room.

## 2021-03-13 ENCOUNTER — Encounter: Admit: 2021-03-13 | Discharge: 2021-03-13 | Payer: MEDICARE

## 2021-03-13 MED ORDER — TRAMADOL 50 MG PO TAB
50 mg | ORAL_TABLET | ORAL | 0 refills | Status: AC | PRN
Start: 2021-03-13 — End: ?

## 2021-03-13 NOTE — Telephone Encounter
LVM notifying patient Tramadol fill had been approved and routed to [place], [place] to sign

## 2021-03-17 ENCOUNTER — Encounter: Admit: 2021-03-17 | Discharge: 2021-03-17 | Payer: MEDICARE

## 2021-03-17 NOTE — Telephone Encounter
Spoke to patient letting him know that we will need cardiac clearance before we can proceed with surgery per anesthesia. Case cancelled for now. Asked him to reach back out once he has completed his cardiac work up.

## 2021-03-18 ENCOUNTER — Encounter: Admit: 2021-03-18 | Discharge: 2021-03-18 | Payer: MEDICARE

## 2021-03-18 DIAGNOSIS — M109 Gout, unspecified: Secondary | ICD-10-CM

## 2021-03-18 DIAGNOSIS — E7800 Pure hypercholesterolemia, unspecified: Secondary | ICD-10-CM

## 2021-03-18 DIAGNOSIS — Z9229 Personal history of other drug therapy: Secondary | ICD-10-CM

## 2021-03-18 DIAGNOSIS — I779 Disorder of arteries and arterioles, unspecified: Secondary | ICD-10-CM

## 2021-03-18 DIAGNOSIS — I219 Acute myocardial infarction, unspecified: Secondary | ICD-10-CM

## 2021-03-18 DIAGNOSIS — I7789 Other specified disorders of arteries and arterioles: Secondary | ICD-10-CM

## 2021-03-18 DIAGNOSIS — I2510 Atherosclerotic heart disease of native coronary artery without angina pectoris: Secondary | ICD-10-CM

## 2021-03-18 DIAGNOSIS — J302 Other seasonal allergic rhinitis: Secondary | ICD-10-CM

## 2021-03-18 DIAGNOSIS — I10 Essential (primary) hypertension: Secondary | ICD-10-CM

## 2021-03-18 DIAGNOSIS — K5792 Diverticulitis of intestine, part unspecified, without perforation or abscess without bleeding: Secondary | ICD-10-CM

## 2021-03-18 DIAGNOSIS — G629 Polyneuropathy, unspecified: Secondary | ICD-10-CM

## 2021-03-18 DIAGNOSIS — M1990 Unspecified osteoarthritis, unspecified site: Secondary | ICD-10-CM

## 2021-03-18 DIAGNOSIS — I671 Cerebral aneurysm, nonruptured: Secondary | ICD-10-CM

## 2021-03-18 DIAGNOSIS — I729 Aneurysm of unspecified site: Secondary | ICD-10-CM

## 2021-03-18 DIAGNOSIS — I639 Cerebral infarction, unspecified: Secondary | ICD-10-CM

## 2021-03-18 DIAGNOSIS — G4733 Obstructive sleep apnea (adult) (pediatric): Secondary | ICD-10-CM

## 2021-03-18 DIAGNOSIS — K429 Umbilical hernia without obstruction or gangrene: Secondary | ICD-10-CM

## 2021-03-20 ENCOUNTER — Encounter: Admit: 2021-03-20 | Discharge: 2021-03-20 | Payer: MEDICARE

## 2021-03-20 DIAGNOSIS — G4733 Obstructive sleep apnea (adult) (pediatric): Secondary | ICD-10-CM

## 2021-03-20 DIAGNOSIS — K5792 Diverticulitis of intestine, part unspecified, without perforation or abscess without bleeding: Secondary | ICD-10-CM

## 2021-03-20 DIAGNOSIS — E7800 Pure hypercholesterolemia, unspecified: Secondary | ICD-10-CM

## 2021-03-20 DIAGNOSIS — Z9229 Personal history of other drug therapy: Secondary | ICD-10-CM

## 2021-03-20 DIAGNOSIS — G629 Polyneuropathy, unspecified: Secondary | ICD-10-CM

## 2021-03-20 DIAGNOSIS — M109 Gout, unspecified: Secondary | ICD-10-CM

## 2021-03-20 DIAGNOSIS — I2510 Atherosclerotic heart disease of native coronary artery without angina pectoris: Secondary | ICD-10-CM

## 2021-03-20 DIAGNOSIS — I671 Cerebral aneurysm, nonruptured: Secondary | ICD-10-CM

## 2021-03-20 DIAGNOSIS — I779 Disorder of arteries and arterioles, unspecified: Secondary | ICD-10-CM

## 2021-03-20 DIAGNOSIS — J302 Other seasonal allergic rhinitis: Secondary | ICD-10-CM

## 2021-03-20 DIAGNOSIS — I729 Aneurysm of unspecified site: Secondary | ICD-10-CM

## 2021-03-20 DIAGNOSIS — I639 Cerebral infarction, unspecified: Secondary | ICD-10-CM

## 2021-03-20 DIAGNOSIS — I10 Essential (primary) hypertension: Secondary | ICD-10-CM

## 2021-03-20 DIAGNOSIS — K429 Umbilical hernia without obstruction or gangrene: Secondary | ICD-10-CM

## 2021-03-20 DIAGNOSIS — I7789 Other specified disorders of arteries and arterioles: Secondary | ICD-10-CM

## 2021-03-20 DIAGNOSIS — M1990 Unspecified osteoarthritis, unspecified site: Secondary | ICD-10-CM

## 2021-03-20 DIAGNOSIS — I213 ST elevation (STEMI) myocardial infarction of unspecified site: Secondary | ICD-10-CM

## 2021-03-20 DIAGNOSIS — I219 Acute myocardial infarction, unspecified: Secondary | ICD-10-CM

## 2021-03-20 NOTE — Progress Notes
Date of Service: 03/20/2021    Xavi Brich is a 65 y.o. male.       HPI   Mr.Vanderschaaf tells me that he developed back discomfort early on 03/04/2021.  When he sought medical attention there was evidence for an acute myocardial infarction with a troponin of 17.5.  He was transferred to [facility] hospital and underwent emergent stenting of the mid left circumflex coronary artery which was 99% obstructed.  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  In the meantime, he reports that he has been stable since undergoing coronary stenting of the left circumflex coronary artery.  Since discharge, the patient has been doing well and reports no angina, congestive symptoms, palpitations, sensation of sustained forceful heart pounding, lightheadedness or syncope.  His exercise tolerance has been stable. The patient reports no myalgias, bleeding abnormalities, or new strokelike symptoms.    Historically, Mr. 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  He was placed on aspirin and Plavix at the time but indicates he did not tolerate Plavix because of muscle aches.  In January 2022 while on vacation in [place], he suffered another stroke which affected fine motor dexterity in his left hand.  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  Mr. Duskey indicates that he presented too late to receive tPA but fortunately had full recovery over time.  He was placed on statin therapy at that time.  The patient has also been followed for hypertension, dyslipidemia, OSA (intermittently on CPAP), and diverticulitis status post prior colon resection. On 02/28/2021 Mr. Menzel underwent right shoulder arthroscopy, rotator cuff repair and biceps tenotomy for treatment of a full-thickness rotator cuff tear, biceps tendinitis, and AC arthritis.       Vitals:    03/20/21 1312   BP: 128/72   BP Source: Arm, Left Upper   Pulse: 62   SpO2: 98%   O2 Device: None (Room air)   PainSc: Four   Weight: 89.5 kg (197 lb 6.4 oz)   Height: 170.2 cm (5' 7)     Body mass index is 30.92 kg/m?Marland Kitchen     Past Medical History  Patient Active Problem List    Diagnosis Date Noted   ? S/P rotator cuff repair 03/18/2021   ? Coronary artery disease involving native coronary artery of native heart 03/06/2021     03/04/21: STEMI/Cath - The mid left circumflex artery has a 99% stenosis.  This is the culprit lesion for the patient's current presentation. The ostium of the LAD has a 70% stenosis.  The mid LAD has 60-70% long segment of stenosis. The 2nd diagonal branch has 70-80% stenosis in the proximal segment. Successful PCI of the mid left circumflex artery with 4.0 x 15 mm Onyx Frontier DES. Measurement of RFR across the LAD was 0.63 suggesting that the stenosis is hemodynamically significant. Intravascular ultrasound of the left main coronary artery revealed minimal lumen area of 20.8 sq mm.       ? Acute ST elevation myocardial infarction (STEMI) (HCC) 03/04/2021   ? STEMI (ST elevation myocardial infarction) (HCC) 03/04/2021     1. 03/04/21- Coronary Angiogram- The mid left circumflex artery has a 99%  stenosis.  This is the culprit lesion for the patient's current presentation.  2. The ostium of the left anterior descending artery has a 70% stenosis.  The mid left anterior descending artery has 60% to 70% long segment of stenosis.  3. The 2nd diagonal branch has 70% to 80% stenosis in the proximal segment.  4. Elevated left ventricular end-diastolic pressure.  5. No significant gradient across the aortic valve on pullback.  6. Successful percutaneous coronary intervention of the mid left circumflex artery with 4.0 x 15 mm Onyx Frontier drug-eluting stent postdilated with 4.5 NC.  7. Measurement of RFR across the left anterior descending artery was 0.63 suggesting that the stenosis is hemodynamically significant.  8. Intravascular ultrasound of the left main coronary artery revealed minimal lumen area of 20.8 sq mm.       ? Right rotator cuff tear 02/28/2021   ? OSA (obstructive sleep apnea) 02/11/2021   ? Intolerance of continuous positive airway pressure (CPAP) ventilation 02/11/2021   ? BMI 32.0-32.9,adult 02/11/2021   ? Traumatic complete tear of right rotator cuff 01/13/2021   ? Biceps tendinitis of right shoulder 01/13/2021   ? 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 Ear fullness, left 05/08/2020   ? Family history of chronic ischemic heart disease 08/05/2016   ? Shortness of breath 08/05/2016   ? Other hyperlipidemia 08/05/2016   ? Essential hypertension 08/05/2016   ? Eustachian tube dysfunction, left 04/29/2016         Review of Systems   Constitutional: Negative.   HENT: Positive for congestion.    Eyes: Negative.    Cardiovascular: Negative.    Respiratory: Negative.    Endocrine: Negative.    Hematologic/Lymphatic: Negative.    Skin: Negative.    Musculoskeletal: Positive for joint pain.   Gastrointestinal: Negative.    Genitourinary: Negative.    Neurological: Negative.    Psychiatric/Behavioral: Negative.    Allergic/Immunologic: Negative.        Physical Exam  GENERAL: The patient is well developed, well nourished, resting comfortably and in no distress.   HEENT: No abnormalities of the visible oro-nasopharynx, conjunctiva or sclera are noted.  NECK: There is no jugular venous distension. Carotids are palpable and without bruits. There is no thyroid enlargement.  Chest: Lung fields are clear to auscultation. There are no wheezes or crackles.  CV: There is a regular rhythm. The first and second heart sounds are normal. There are no murmurs, gallops or rubs.  ABD: The abdomen is soft and supple with normal bowel sounds. There is no hepatosplenomegaly, ascites, tenderness, masses or bruits.  Neuro: There are no focal motor defects. Ambulation is normal. Cognitive function appears normal.  Ext: There is no edema or evidence of deep vein thrombosis. Peripheral pulses are satisfactory.  His right arm is in a sling recovering from right shoulder surgery.  SKIN: There are no rashes and no cellulitis  PSYCH: The patient is calm, rationale and oriented.    Cardiovascular Studies  A twelve-lead ECG obtained on 03/04/2021 revealed normal sinus rhythm with a heart rate of 79 bpm.  There is evidence for inferior infarct, age old or indeterminate.    Coronary angiography 03/04/2021:  FINAL IMPRESSION:    1. The mid left circumflex artery has a 99% stenosis.  This is the culprit lesion for the patient's current presentation.  2. The ostium of the left anterior descending artery has a 70% stenosis.  The mid left anterior descending artery has 60% to 70%  long segment of stenosis.  3. The 2nd diagonal branch has 70% to 80% stenosis in the proximal segment.  4. Elevated left ventricular end-diastolic pressure.  5. No significant gradient across the aortic valve on pullback.  6. Successful percutaneous coronary intervention of the mid left circumflex artery with 4.0 x 15 mm Onyx Frontier drug-eluting stent postdilated with 4.5 NC.  7. Measurement of RFR across the left anterior descending artery was 0.63 suggesting that the stenosis is hemodynamically significant.  8. Intravascular ultrasound of the left main coronary artery revealed minimal lumen area of 20.8 sq mm.  ?  RECOMMENDATION:    1. Aspirin 81 mg daily for life.  2. Brilinta 90 mg twice daily for at least 12 months.  We will refer the patient for cardiothoracic surgery consultation for possible CABG with LIMA to the LAD and SVG-Diag.  If the surgery is to be performed, the ticagrelor should be continued for a month and then stopped for the procedure.  3. Referred for cardiac rehab.  4. Aggressive risk factor modification.  5. Routine post PCI care.      Echo Doppler 03/04/2021  ? The left ventricular size is normal. Normal geometry. The left ventricular systolic function is normal. The visually estimated ejection fraction is 65%. There are segmental wall motion abnormalities, as coded in the diagram below  ? The right ventricular size is normal. The right ventricular systolic function is normal.  ? Mild biatrial enlargement.  ? No significant valvular abnormalities.  ? Estimated Peak Systolic PA Pressure 31 mmHg  ? No pericardial effusion.  ?  Echocardiographic Findings    Left Ventricle The left ventricular size is normal. Normal geometry. The left ventricular systolic function is normal. The visually estimated ejection fraction is 65%. There are segmental wall motion abnormalities, as coded in the diagram below. Normal left ventricular diastolic function. Normal left atrial pressure.   Right Ventricle The right ventricular size is normal. The right ventricular systolic function is normal.   Left Atrium Mildly dilated.   Right Atrium Mildly dilated.   IVC/SVC Elevated central venous pressure (5-10 mm Hg).   Mitral Valve Normal valve structure. No stenosis. Mild regurgitation.   Tricuspid Valve Normal valve structure. No stenosis. Trace regurgitation.   Aortic Valve Normal valve structure. No stenosis. No regurgitation.   Pulmonary The pulmonic valve was not seen well but no Doppler evidence of stenosis. Trace regurgitation.   Aorta The aortic root and ascending aorta are normal in size.   Pericardium No pericardial effusion.   ?     Left Ventricular Wall Scoring    Resting Score Index: 1.353 Percent Normal: 82.4%             The following segments are akinetic: basal inferolateral, mid inferolateral and mid anterolateral.  All other segments are normal.                 Cardiovascular Health Factors  Vitals BP Readings from Last 3 Encounters:   03/20/21 128/72   03/12/21 123/66 03/06/21 (!) 84/60     Wt Readings from Last 3 Encounters:   03/20/21 89.5 kg (197 lb 6.4 oz)   03/05/21 93.7 kg (206 lb 9.1 oz)   02/28/21 94.1 kg (207 lb 7.3 oz)     BMI Readings from Last 3 Encounters:   03/20/21 30.92 kg/m?   03/05/21 32.35 kg/m?   02/28/21 32.49 kg/m?      Smoking Social History     Tobacco Use   Smoking  Status Never Smoker   Smokeless Tobacco Never Used      Lipid Profile Cholesterol   Date Value Ref Range Status   03/04/2021 90 <200 MG/DL Final     HDL   Date Value Ref Range Status   03/04/2021 42 >40 MG/DL Final     LDL   Date Value Ref Range Status   03/04/2021 43 <100 mg/dL Final     Triglycerides   Date Value Ref Range Status   03/04/2021 45 <150 MG/DL Final      Blood Sugar Hemoglobin A1C   Date Value Ref Range Status   03/04/2021 6.0 4.0 - 6.0 % Final     Comment:     The ADA recommends that most patients with type 1 and type 2 diabetes maintain   an A1c level <7%.       Glucose   Date Value Ref Range Status   03/06/2021 106 (H) 70 - 100 MG/DL Final   45/40/9811 914 (H) 70 - 100 MG/DL Final   78/29/5621 308 (H) 70 - 100 MG/DL Final          Problems Addressed Today  Coronary artery disease.  Hypercholesterolemia.  Stroke.  Carotid artery disease.  Acute myocardial infarction.  Assessment and Plan     Mr. Braaksma's chart indicates that he had a recent ST elevation myocardial infarction on 03/04/2021.  He underwent emergent stenting of his left circumflex coronary at that time.  Mr. Karstens indicates that he is scheduled to meet with Dr. Samule Ohm from cardiothoracic surgery on March 27, 2019 to discuss coronary artery bypass surgery for residual coronary artery disease in his left anterior descending coronary artery.  In the meantime, Mr. Dufour indicates that he has been stable and reports no angina or congestive symptoms.  He is currently on statin therapy and his blood pressure is well controlled.  No changes were made in his medical regimen today.  I have asked him to return for follow-up in 6 months time.The total time spent during this interview and exam was 40 minutes.         Current Medications (including today's revisions)  ? allopurinoL (ZYLOPRIM) 100 mg tablet Take 200 mg by mouth at bedtime daily. Take with food.   ? allopurinoL (ZYLOPRIM) 300 mg tablet Take 300 mg by mouth daily. Take with food.   ? aspirin EC 81 mg tablet Take 81 mg by mouth daily. Take with food.   ? colchicine (COLCRYS) 0.6 mg tablet Take one tablet by mouth daily for 90 days.   ? coQ10 (ubiquinol) 100 mg cap Take 1 capsule by mouth at bedtime daily.   ? fexofenadine (ALLEGRA) 180 mg tablet Take 180 mg by mouth daily.   ? furosemide (LASIX) 20 mg tablet Take one tablet by mouth daily as needed. Take one tablet if you gain 2 pounds in 1 day or 5 pounds in one week. You may additionally take if you become more short of breath or notice swelling in your lower legs.   ? glucosamine-D3-Boswellia serr 1,500-400-100 mg-unit-mg tab Take 1 tablet by mouth daily.   ? MEN'S MULTI-VITAMIN PO Take 1 tablet by mouth daily.   ? metoprolol tartrate 25 mg tablet Take one-half tablet by mouth twice daily.   ? nitroglycerin (NITROSTAT) 0.4 mg tablet Place one tablet under tongue every 5 minutes as needed for Chest Pain. Max of 3 tablets, call 911.   ? oxyCODONE/acetaminophen (PERCOCET) 10/325 mg tablet Take one tablet to two tablets  by mouth every 4 hours as needed for Pain.  Max 8 tabs/day   ? Potassium Gluconate 595 mg (99 mg) tab Take 595 mg by mouth daily.   ? rosuvastatin (CRESTOR) 20 mg tablet Take 20 mg by mouth at bedtime daily.   ? sodium chloride (SEA MIST) 0.65 % nasal spray Apply 2 sprays to each nostril as directed as Needed.   ? sour cherry extract (TART CHERRY EXTRACT) 1,000 mg cap Take 1 capsule by mouth daily.   ? ticagrelor (BRILINTA) 90 mg tablet Take one tablet by mouth twice daily.   ? traMADoL (ULTRAM) 50 mg tablet Take one tablet by mouth every 6-8 hours as needed for Pain. Indications: pain   ? triamcinolone (NASACORT) 55 mcg nasal inhaler Apply 2 sprays to each nostril as directed daily.

## 2021-03-20 NOTE — Patient Instructions
Thank you for visiting our office today.    We would like to make the following medication adjustments:  NONE      Otherwise continue the same medications as you have been doing.          We will be pursuing the following tests after your appointment today:NONE            We will plan to see you back in 6 months.  Please call us in the meantime with any questions or concerns.        Please allow 5-7 business days for our providers to review your results. All normal results will go to MyChart. If you do not have Mychart, it is strongly recommended to get this so you can easily view all your results. If you do not have mychart, we will attempt to call you once with normal lab and testing results. If we cannot reach you by phone with normal results, we will send you a letter.  If you have not heard the results of your testing after one week please give [place] a call.       Your Cardiovascular [facility] [place] Team Brett Canales, Pilar Jarvis, Shawna Orleans, and [place])  phone number is [phone].

## 2021-03-21 IMAGING — CR [ID]
3 series · 3 of 3 positions shown · non-contrast
Comparison: none

[knee ap]
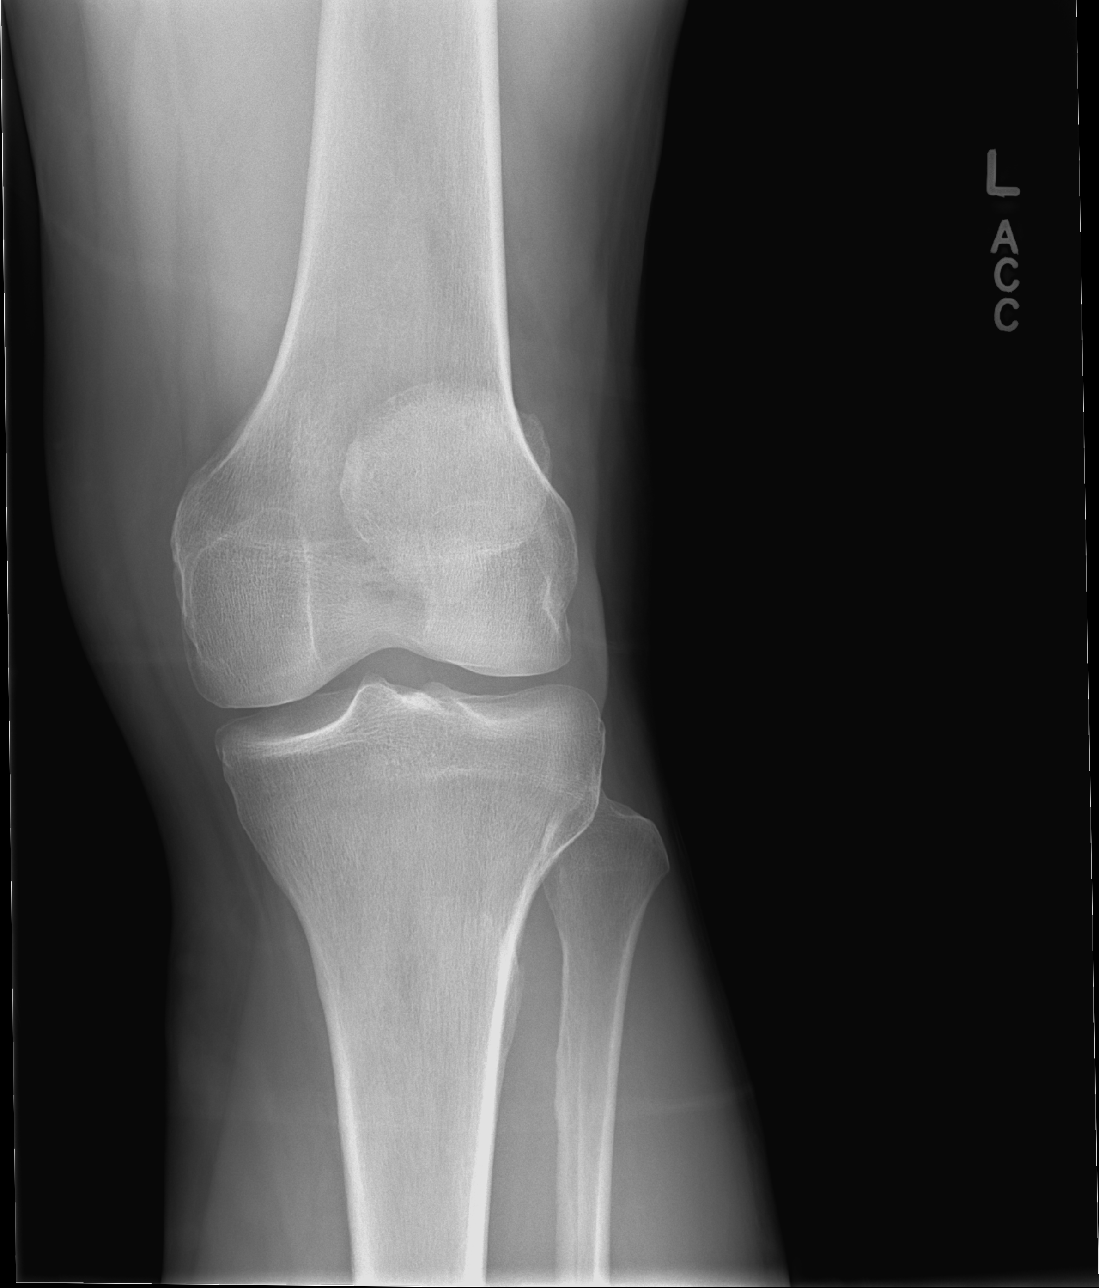

[knee sunrise]
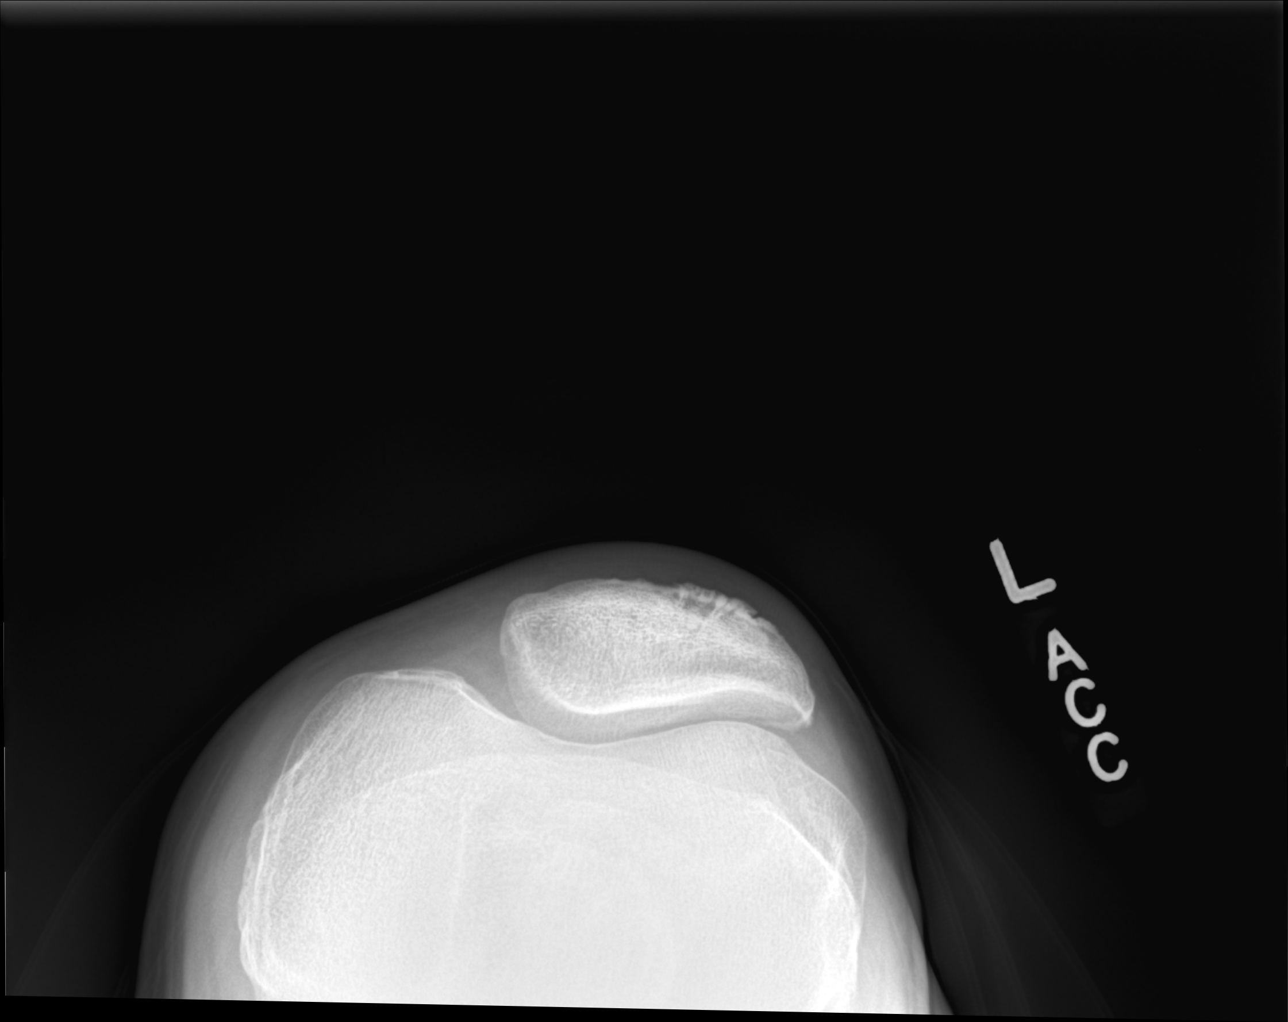

[knee lat]
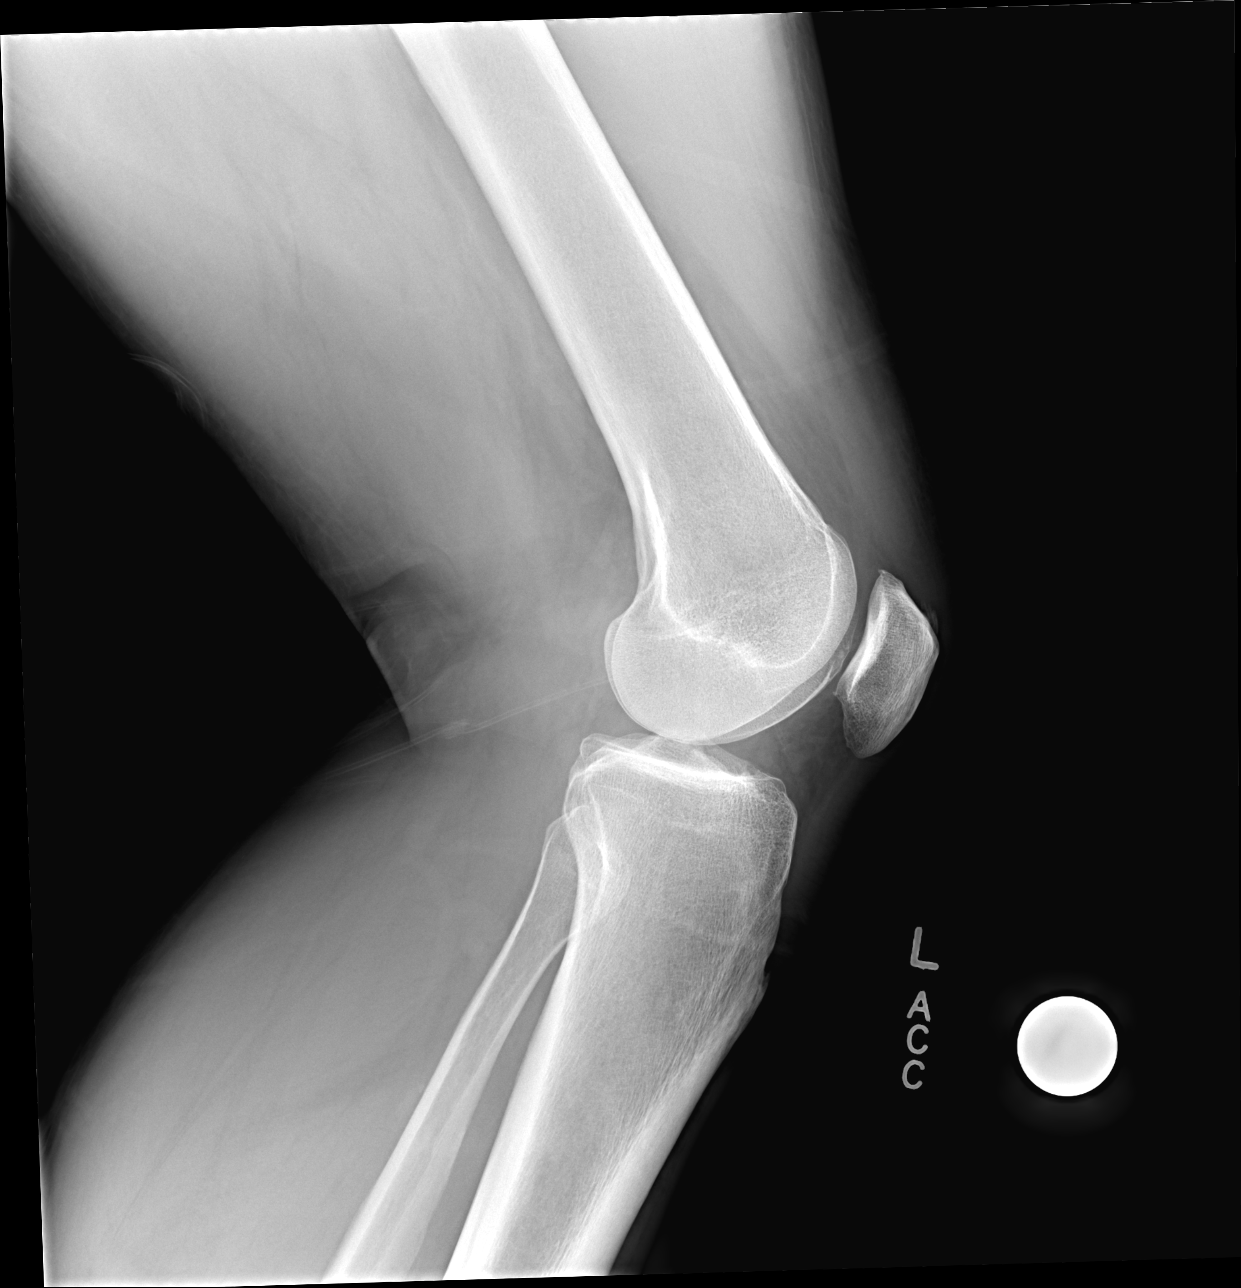

[3 of 3 positions shown; findings below may reference images not displayed]

DIAGNOSTIC STUDIES

EXAM

XR knee LT 3V

INDICATION

knee pain
Left knee pain, no known injury.

TECHNIQUE

XR knee LT 3V

COMPARISONS

None

FINDINGS

No acute fracture or dislocation. Very minimal degenerative change with tiny patellar osteophytes
noted. No joint effusion is seen. Overlying soft tissues are within normal limits.

IMPRESSION

No acute osseous findings.

Very mild anterior compartment osteoarthritis.

Tech Notes:

Left knee pain, no known injury.

## 2021-03-26 ENCOUNTER — Encounter: Admit: 2021-03-26 | Discharge: 2021-03-26 | Payer: MEDICARE

## 2021-03-26 ENCOUNTER — Ambulatory Visit: Admit: 2021-03-26 | Discharge: 2021-03-26 | Payer: MEDICARE

## 2021-03-26 DIAGNOSIS — I671 Cerebral aneurysm, nonruptured: Secondary | ICD-10-CM

## 2021-03-26 DIAGNOSIS — I2121 ST elevation (STEMI) myocardial infarction involving left circumflex coronary artery: Secondary | ICD-10-CM

## 2021-03-26 DIAGNOSIS — I779 Disorder of arteries and arterioles, unspecified: Secondary | ICD-10-CM

## 2021-03-26 DIAGNOSIS — I7789 Other specified disorders of arteries and arterioles: Secondary | ICD-10-CM

## 2021-03-26 DIAGNOSIS — Z9229 Personal history of other drug therapy: Secondary | ICD-10-CM

## 2021-03-26 DIAGNOSIS — I729 Aneurysm of unspecified site: Secondary | ICD-10-CM

## 2021-03-26 DIAGNOSIS — K5792 Diverticulitis of intestine, part unspecified, without perforation or abscess without bleeding: Secondary | ICD-10-CM

## 2021-03-26 DIAGNOSIS — G629 Polyneuropathy, unspecified: Secondary | ICD-10-CM

## 2021-03-26 DIAGNOSIS — G4733 Obstructive sleep apnea (adult) (pediatric): Secondary | ICD-10-CM

## 2021-03-26 DIAGNOSIS — I639 Cerebral infarction, unspecified: Secondary | ICD-10-CM

## 2021-03-26 DIAGNOSIS — I2510 Atherosclerotic heart disease of native coronary artery without angina pectoris: Secondary | ICD-10-CM

## 2021-03-26 DIAGNOSIS — K429 Umbilical hernia without obstruction or gangrene: Secondary | ICD-10-CM

## 2021-03-26 DIAGNOSIS — Z955 Presence of coronary angioplasty implant and graft: Secondary | ICD-10-CM

## 2021-03-26 DIAGNOSIS — I6522 Occlusion and stenosis of left carotid artery: Secondary | ICD-10-CM

## 2021-03-26 DIAGNOSIS — J302 Other seasonal allergic rhinitis: Secondary | ICD-10-CM

## 2021-03-26 DIAGNOSIS — Z8249 Family history of ischemic heart disease and other diseases of the circulatory system: Secondary | ICD-10-CM

## 2021-03-26 DIAGNOSIS — M1990 Unspecified osteoarthritis, unspecified site: Secondary | ICD-10-CM

## 2021-03-26 DIAGNOSIS — Z01818 Encounter for other preprocedural examination: Secondary | ICD-10-CM

## 2021-03-26 DIAGNOSIS — Z9889 Other specified postprocedural states: Secondary | ICD-10-CM

## 2021-03-26 DIAGNOSIS — E7800 Pure hypercholesterolemia, unspecified: Secondary | ICD-10-CM

## 2021-03-26 DIAGNOSIS — S46011A Strain of muscle(s) and tendon(s) of the rotator cuff of right shoulder, initial encounter: Secondary | ICD-10-CM

## 2021-03-26 DIAGNOSIS — M109 Gout, unspecified: Secondary | ICD-10-CM

## 2021-03-26 DIAGNOSIS — I219 Acute myocardial infarction, unspecified: Secondary | ICD-10-CM

## 2021-03-26 DIAGNOSIS — I10 Essential (primary) hypertension: Secondary | ICD-10-CM

## 2021-03-26 DIAGNOSIS — Z6832 Body mass index (BMI) 32.0-32.9, adult: Secondary | ICD-10-CM

## 2021-03-26 MED FILL — METOPROLOL TARTRATE 25 MG PO TAB: 25 mg | ORAL | 30 days supply | Qty: 30 | Fill #2 | Status: CP

## 2021-03-26 NOTE — Progress Notes
Date of Service: 03/26/2021       Subjective:             Ronnie Williams is a 65 y.o. male.      History of Present Illness  We had the pleasure of seeing Ronnie Williams in clinic today for re-evaluation of their coronary artery disease. Ronnie Williams is a 65 y.o. male whom we met in the hospital on 03/04/2021.  He underwent a rotator cuff surgery on 02/28/2021 and reported to an outside hospital on 03/04/2021 with severe chest pain.  His troponin was 17.5 and he was transferred to [facility] for a STEMI and was taken to the Cath Lab for left heart cath.  He had a PCI with drug-eluting stent placed to the left circumflex.  We were consulted at that time for CABG evaluation.  He had residual LAD and diagonal disease and had been started on Brilinta for his PCI.  We asked that he see [place] in clinic a couple weeks after his hospital stay to rediscuss surgical intervention for his coronary artery disease.    His current medical history includes CVA x2 with the first 1 in 2014 and seen at [facility]. His second CVA was in February 2022 while he was in [place].  He does not have any residual from either stroke.  While he was in [place] he was found to have significant carotid artery disease.  He established care with neurology and vascular surgery in [place].  During that evaluation he was informed of having a cerebral aneurysm and has been told there is no intervention for this.  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  His other current medical history includes diverticulitis status post colon resection, gout, hypertension, obstructive sleep apnea on CPAP, umbilical hernia.    He had an outside carotid ultrasound on 01/01/2021 that showed:  Conclusions: <50% right internal carotid artery stenosis.   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   Antegrade flow in the vertebral arteries.    He had a CTA of the chest on 03/04/2021 that showed:  No pulmonary embolism given limitations of exam.   Mild lung edema. Peribronchial and dependent consolidation in the lower lobes is likely in large part due to atelectasis. Aspiration could appear similar.   Mild cardiomegaly and marked coronary artery calcification.   Age indeterminate mild superior endplate compression deformity of T8.     He had an echocardiogram on 03/04/2021 that showed:  ? The left ventricular size is normal. Normal geometry. The left ventricular systolic function is normal. The visually estimated ejection fraction is 65%. There are segmental wall motion abnormalities, as coded in the diagram below  ? The right ventricular size is normal. The right ventricular systolic function is normal.  ? Mild biatrial enlargement.  ? No significant valvular abnormalities.  ? Estimated Peak Systolic PA Pressure 31 mmHg  ? No pericardial effusion.    He had a left heart catheterization on 03/04/2021 that showed:  1. The mid left circumflex artery has a 99% stenosis. ?This is the culprit lesion for the patient's current presentation.  2. The ostium of the left anterior descending artery has a 70% stenosis. ?The mid left anterior descending artery has 60% to 70% long segment of stenosis.  3. The 2nd diagonal branch has 70% to 80% stenosis in the proximal segment.  4. Elevated left ventricular end-diastolic pressure.  5. No significant gradient  across the aortic valve on pullback.  6. Successful percutaneous coronary intervention of the mid left circumflex artery with 4.0 x 15 mm Onyx Frontier drug-eluting stent postdilated with 4.5 NC.  7. Measurement of RFR across the left anterior descending artery was 0.63 suggesting that the stenosis is hemodynamically significant.  8. Intravascular ultrasound of the left main coronary artery revealed minimal lumen area of 20.8 sq mm.    He had a chest x-ray on 03/04/2021 that showed:  Cardiac silhouette enlargement with findings suggestive of pulmonary edema. Infection or aspiration not excluded in the left perihilar and basal region given some nodularity. Follow-up radiographs suggested.     He had vein mapping on 03/04/2021 that showed:  9. Normal compression and no visualized thrombus in the measured venous segments  10. Right GSV is suitable for conduit from the proximal thigh to the knee and then tapers distally  11. Left GSV is suitable for conduit from the proximal thigh to the proximal calf and then tapers  12. Detailed measurements are below      He presents to clinic today with a right arm immobilizer in place from his recent rotator cuff surgery.  This is scheduled to come off in 4 weeks with an additional 8 to 10 weeks of physical therapy.  He states overall he has remained asymptomatic.  He has not had any reoccurring back or shoulder pain that he had when he presented to the hospital.  Looking back he has noticed some dizziness on occasion but not persistent as well as some numbness in his left fingers on occasion as well.  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  He thought his numbness in his fingers was related to using his left arm more often.  Of note, his family history is significant for a brother having an MI at the age of 65 and another brother having blockages requiring intervention.  He denies any history of seizures, cardiac arrhythmia, cancer, blood clots, or blood clotting disorders.  He denies current rash on torso or groin area. Denies any reason or religious belief that would impact their care such as receiving blood, tissue, or animal products.     He is here today to discuss possible surgical interventions.     This was all reviewed today by Dr. Elvis Coil and was discussed at length with Ronnie Williams.          Review of Systems   Constitutional: Negative.   HENT: Negative.    Eyes: Negative.    Cardiovascular: Negative.    Respiratory: Negative.    Endocrine: Negative. Hematologic/Lymphatic: Negative.    Skin: Negative.    Musculoskeletal: Negative.    Gastrointestinal: Negative.    Genitourinary: Negative.    Neurological: Negative.    Psychiatric/Behavioral: Negative.    Allergic/Immunologic: Negative.      Medical History:   Diagnosis Date   ? Aneurysm (HCC)    ? Arthritis    ? Carotid artery disease (HCC)     CTO Left, and 50% blockage on Right carotid   ? Cerebral aneurysm    ? Coronary artery disease    ? Diverticulitis    ? Gout    ? Heart attack (HCC)    ? High cholesterol    ? HX: anticoagulation    ? Hypertension    ? Neuropathy    ? OSA on CPAP    ? Other specified disorders of arteries and  arterioles (HCC)    ? Seasonal allergic reaction    ? Stroke [facility]) 2014   ? Stroke [facility]) 06/2020   ? Umbilical hernia      Surgical History:   Procedure Laterality Date   ? HX EAR TUBES  02/25/2016   ? right shoulder arthroscopy, rotator cuff repair Right 02/28/2021    Performed by Dolan Amen, MD at [facility]   ? right shoulder arthroscopy,  distal clavicle excision Right 02/28/2021    Performed by Dolan Amen, MD at [facility]   ? right shoulder arthroscopy,  biceps tenotomy Right 02/28/2021    Performed by Dolan Amen, MD at [facility]   ? CARDIAC CATHERIZATION     ? COLECTOMY      colon resection   ? COLONOSCOPY     ? HX ADENOIDECTOMY     ? HX HEART CATHETERIZATION     ? HX TONSILLECTOMY       Social History     Socioeconomic History   ? Marital status: Married   Tobacco Use   ? Smoking status: Never Smoker   ? Smokeless tobacco: Never Used   Substance and Sexual Activity   ? Alcohol use: Yes     Alcohol/week: 1.0 standard drink     Types: 1 Cans of beer per week     Comment: 1 drink per week   ? Drug use: No     Family History   Problem Relation Age of Onset   ? COPD Mother    ? High Cholesterol Father    ? Heart Attack Brother    ? Heart Disease Brother         s/p PCI with stent placement   ? Heart Disease Maternal Grandfather    ? Diabetes Daughter    ? Crohn's Disease Daughter          Objective:         ? allopurinoL (ZYLOPRIM) 100 mg tablet Take 200 mg by mouth at bedtime daily. Take with food.   ? allopurinoL (ZYLOPRIM) 300 mg tablet Take 300 mg by mouth daily. Take with food.   ? aspirin EC 81 mg tablet Take 81 mg by mouth daily. Take with food.   ? coQ10 (ubiquinol) 100 mg cap Take 1 capsule by mouth at bedtime daily.   ? fexofenadine (ALLEGRA) 180 mg tablet Take 180 mg by mouth daily.   ? furosemide (LASIX) 20 mg tablet Take one tablet by mouth daily as needed. Take one tablet if you gain 2 pounds in 1 day or 5 pounds in one week. You may additionally take if you become more short of breath or notice swelling in your lower legs.   ? glucosamine-D3-Boswellia serr 1,500-400-100 mg-unit-mg tab Take 1 tablet by mouth daily.   ? MEN'S MULTI-VITAMIN PO Take 1 tablet by mouth daily.   ? metoprolol tartrate 25 mg tablet Take one-half tablet by mouth twice daily.   ? nitroglycerin (NITROSTAT) 0.4 mg tablet Place one tablet under tongue every 5 minutes as needed for Chest Pain. Max of 3 tablets, call 911.   ? Potassium Gluconate 595 mg (99 mg) tab Take 595 mg by mouth daily.   ? rosuvastatin (CRESTOR) 20 mg tablet Take 20 mg by mouth at bedtime daily.   ? sodium chloride (SEA MIST) 0.65 % nasal spray Apply 2 sprays to each nostril as directed as Needed.   ? sour cherry extract (TART CHERRY EXTRACT) 1,000 mg cap  Take 1 capsule by mouth daily.   ? ticagrelor (BRILINTA) 90 mg tablet Take one tablet by mouth twice daily.   ? traMADoL (ULTRAM) 50 mg tablet Take one tablet by mouth every 6-8 hours as needed for Pain. Indications: pain   ? triamcinolone (NASACORT) 55 mcg nasal inhaler Apply 2 sprays to each nostril as directed daily.     Vitals:    03/26/21 1054   BP: 128/72   BP Source: Arm, Left Upper   Pulse: 68   SpO2: 98%   O2 Device: None (Room air)   PainSc: Zero   Weight: 89.8 kg (198 lb)   Height: 170.2 cm (5' 7)     Body mass index is 31.01 kg/m?Marland Kitchen     Physical Exam  Constitutional:       Appearance: Normal appearance. He is normal weight.   Cardiovascular:      Rate and Rhythm: Normal rate and regular rhythm.      Pulses: Normal pulses.      Heart sounds: Normal heart sounds. No murmur heard.  Pulmonary:      Effort: Pulmonary effort is normal.      Breath sounds: Normal breath sounds.   Abdominal:      Palpations: Abdomen is soft.   Musculoskeletal:      Right lower leg: No edema.      Left lower leg: No edema.      Comments: Right arm in shoulder immobilizer    Skin:     General: Skin is warm and dry.   Neurological:      Mental Status: He is alert and oriented to person, place, and time.   Psychiatric:         Mood and Affect: Mood normal.         Behavior: Behavior normal.         Thought Content: Thought content normal.         Judgment: Judgment normal.         Assessment:  1. Coronary artery disease involving native coronary artery of native heart, unspecified whether angina present    2. Acute ST elevation myocardial infarction (STEMI) involving left circumflex coronary artery (HCC)    3. ST elevation myocardial infarction involving left circumflex coronary artery (HCC)    4. S/P coronary artery stent placement    5. HX: anticoagulation    6. Cerebrovascular accident (CVA), unspecified mechanism (HCC)    7. Occlusion of left carotid artery    8. Family history of chronic ischemic heart disease    9. Cerebral aneurysm    10. Traumatic complete tear of right rotator cuff, initial encounter    11. S/P rotator cuff repair    12. Diverticulitis    13. BMI 32.0-32.9,adult    14. OSA (obstructive sleep apnea)    15. Essential hypertension    16. Pre-op evaluation            He does require coronary artery bypass grafting surgery and we will give him more time on his Brilinta as well as give him more time to heal from his recent rotator cuff operation and to hopefully get his immobilizer off prior to surgery.  For this we will wait until early January.  He is also in favor of waiting until after the first of the year so he can recover from 1 surgery prior to starting a second. We will schedule to see him back in clinic for an updated H&P and he will  get complete pulmonary function testing at that time to complete his work-up.  We will also asked that he get neuro clearance in the interim for his known cerebral aneurysm and 2 prior CVAs.  He will need to stop his Brilinta 5 days prior to surgery and we will communicate this to him as we get date scheduled.           Assessment and Plan:  Seen again in clinic. Recent admit for MI, PCI to [facility]. On Brilinta. LHC again reviewed, discussed plans with Dr. Argie Ramming. He has severe LAD stenosis as well, and a diag that appears to be a possible target. The RCA is small. His EF appears preserved.     After discussion with Dr. Argie Ramming and the patient, it seems most reasonable to continue 2-3 months of Brilinta, but would potentially be able to stop it earlier for CABG if the patient develops symptoms. He currently has none. I discussed Nitroglycerin usage, and if the patient is taking NTG, he should alert [place] or Dr. Argie Ramming office as we would want to discuss moving up his CABG.     At this time, he will continue with his Right shoulder PT, and we would like him to see Neuro team. We will schedule a visit back in January 2023 with a sugical date at that time. I have discussed the CABG procedure, as well as the risks of the procedure, including death, stroke, bleeding, MI, need for additional surgery. The patient has expressed understanding and he has asked to proceed with scheduling. He will eventually need to hold Brilinta for 5 days prior to OR. We will then start Plavix after the procedure, if it is more than 30 days after his PCI.     Elvis Coil MD

## 2021-03-28 ENCOUNTER — Encounter: Admit: 2021-03-28 | Discharge: 2021-03-28 | Payer: MEDICARE

## 2021-03-28 NOTE — Progress Notes
Spoke to [place] regarding neuro clearance prior to 05/2021 CABG. He states he will get an appointment soon with his OSH neurologist for clearance. Dr. Melinda Crutch assistant will be calling today for surgery date.

## 2021-03-31 ENCOUNTER — Encounter: Admit: 2021-03-31 | Discharge: 2021-03-31 | Payer: MEDICARE

## 2021-03-31 DIAGNOSIS — Z01818 Encounter for other preprocedural examination: Secondary | ICD-10-CM

## 2021-03-31 DIAGNOSIS — I2510 Atherosclerotic heart disease of native coronary artery without angina pectoris: Secondary | ICD-10-CM

## 2021-03-31 DIAGNOSIS — I671 Cerebral aneurysm, nonruptured: Secondary | ICD-10-CM

## 2021-03-31 DIAGNOSIS — Z955 Presence of coronary angioplasty implant and graft: Secondary | ICD-10-CM

## 2021-03-31 DIAGNOSIS — I6522 Occlusion and stenosis of left carotid artery: Secondary | ICD-10-CM

## 2021-03-31 NOTE — Telephone Encounter
Pt called vm line wanting to speak with Angelique Blonder NP nurse about approval for surg working on what's going to be acceptable for insurance. Trying to get surgery for January open heart surgery.    Please call back

## 2021-03-31 NOTE — Progress Notes
Ronnie Williams called [facility] to clarify his neurology appointment. Ronnie Williams states he was evaluated by vascular surgery , [name], and also [name] (IR).  Ronnie Williams wanted to see if that would help him with neurology clearance prior to CABG.      [name] he would need neurology appointment.  Ronnie Williams states he would like to establish with [place] neurology but will call his neurology team with an appointment at [facility].  [name] to let the C[facility] team know when he has an appointment with OSH neurology team.  Referral entered for [place] neurology per patient preference.

## 2021-03-31 NOTE — Telephone Encounter
Contacted patient to discuss questions. Per patient and spouse he is scheduled for CABG on 05/29/2021. Cardiology is wanting him to get neurology clearance prior to surgery d/t history of CVA and cerebral aneurysm. They are asking if they could get an appointment with our office to get clearance for the upcoming surgery because patients neurologist in [facility] cannot see him until late January 2023. Patient informed me that cardiology did place a referral to [facility] as patient would like care transferred to the health system.  Informed patient and spouse they need to contact Neurology clinic for an appt. Reviewed with them that Zoe, RN in cardiology did place the referral. Gave them the office number with  neurology to contact for appt. Explained to them that Vascular Surgery would not be able to provide neurology clearance for upcoming surgery. They verbalized understanding with plan of care.

## 2021-04-01 ENCOUNTER — Encounter: Admit: 2021-04-01 | Discharge: 2021-04-01 | Payer: MEDICARE

## 2021-04-01 NOTE — Telephone Encounter
Pre Visit Planning- New Patient    Patient notified of upcoming appointment.    Patient active in MyChart. No appointment reminder sent. .     Imaging records received: received     Outside records received: patient to bring records    Updated chart: Medication, History and Allergies    Orders have been NA    New patient paperwork complete: No

## 2021-04-01 NOTE — Telephone Encounter
Spoke to [facility] radiology. Patient has MRI of upper extremities and CTA Head and neck complete. We have imaging for MRI upper extremities but no report. We also have reports of CTA head and neck but no imaging. Radiologist asked for fax number to send over reports and stated she would cloud imaging over to you Steina[place]. Fax number was provided.

## 2021-04-01 NOTE — Progress Notes
11:50 AM-RN discussed patient's history with Dr. Sunday Corn of 2 CVA's (most recent in February 2022) and that he has been seen by neurologist at [facility] Jomarie Longs (wants to transfer to neurologist through [facility]). Notified him that cardiothoracic surgeon wants patient to have neuro evaluation prior to CABG and that he had a STEMI 4 days after a rotator cuff surgery in October. Dr. Sunday Corn states that he is happy to see patient and evaluate him, but that it will be up to the cardiothoracic surgeon to weigh the risks versus benefits of CABG as patient has risk of possible stroke during surgery. Dr. Sunday Corn advised that he will not provide a neurology clearance for surgery.    12:06 PM-Per Dr. Dorris Fetch request, this RN contacted Dr. Melinda Crutch nurse Graceann Congress) via [employer] Teams to advise her of what Dr. Sunday Corn stated. Kirt Boys stated she will notify Dr. Samule Ohm and requested Dr. Sunday Corn make it clear in his note. No further questions/concerns at this time.

## 2021-04-01 NOTE — Telephone Encounter
Spoke to patient regarding upcoming appointment. Patient was placed on our schedule per pending approval. Dr.Southwell is able to evaluate patient. Confirmed appointment time and location with patient. Patient stated he would make it work. Unable to go over pre-visit planning due to patient requesting I give a call back in 10 minutes. I informed patient I would.

## 2021-04-02 ENCOUNTER — Encounter: Admit: 2021-04-02 | Discharge: 2021-04-02 | Payer: MEDICARE

## 2021-04-02 ENCOUNTER — Ambulatory Visit: Admit: 2021-04-02 | Discharge: 2021-04-02 | Payer: MEDICARE

## 2021-04-02 DIAGNOSIS — Z8673 Personal history of transient ischemic attack (TIA), and cerebral infarction without residual deficits: Secondary | ICD-10-CM

## 2021-04-02 DIAGNOSIS — I639 Cerebral infarction, unspecified: Secondary | ICD-10-CM

## 2021-04-02 DIAGNOSIS — K5792 Diverticulitis of intestine, part unspecified, without perforation or abscess without bleeding: Secondary | ICD-10-CM

## 2021-04-02 DIAGNOSIS — I10 Essential (primary) hypertension: Secondary | ICD-10-CM

## 2021-04-02 DIAGNOSIS — M109 Gout, unspecified: Secondary | ICD-10-CM

## 2021-04-02 DIAGNOSIS — I219 Acute myocardial infarction, unspecified: Secondary | ICD-10-CM

## 2021-04-02 DIAGNOSIS — G4733 Obstructive sleep apnea (adult) (pediatric): Secondary | ICD-10-CM

## 2021-04-02 DIAGNOSIS — M1990 Unspecified osteoarthritis, unspecified site: Secondary | ICD-10-CM

## 2021-04-02 DIAGNOSIS — I779 Disorder of arteries and arterioles, unspecified: Secondary | ICD-10-CM

## 2021-04-02 DIAGNOSIS — I2510 Atherosclerotic heart disease of native coronary artery without angina pectoris: Secondary | ICD-10-CM

## 2021-04-02 DIAGNOSIS — I729 Aneurysm of unspecified site: Secondary | ICD-10-CM

## 2021-04-02 DIAGNOSIS — K429 Umbilical hernia without obstruction or gangrene: Secondary | ICD-10-CM

## 2021-04-02 DIAGNOSIS — E7800 Pure hypercholesterolemia, unspecified: Secondary | ICD-10-CM

## 2021-04-02 DIAGNOSIS — I7789 Other specified disorders of arteries and arterioles: Secondary | ICD-10-CM

## 2021-04-02 DIAGNOSIS — G629 Polyneuropathy, unspecified: Secondary | ICD-10-CM

## 2021-04-02 DIAGNOSIS — Z9229 Personal history of other drug therapy: Secondary | ICD-10-CM

## 2021-04-02 DIAGNOSIS — I671 Cerebral aneurysm, nonruptured: Secondary | ICD-10-CM

## 2021-04-02 DIAGNOSIS — J302 Other seasonal allergic rhinitis: Secondary | ICD-10-CM

## 2021-04-02 NOTE — Progress Notes
Date of Service: 04/02/2021     Subjective:               Ronnie Williams is a 65 y.o. male.        History of Present Illness    65 year old RH male referred by Dr. Elvis Coil for neurologic evaluation in advance of planned coronary bypass grafting scheduled in Jan 2023.    He has a past history of cerebral aneurysm, HTN, coronary artery disease, STEMI with cardiac stent and CVAx2.    I have reviewed outside neurology notes from [facility] in [place].  Reported history of stroke in 2014 with question of atrial fibrillation.  Loop recorder was done and did not show any abnormality by report.  Symptoms included total numbness in the whole left side.  He was unable to leift the left arm.  He went to hopsital in [place] and was treated with tPA.  He was flown to [facility] and was monitored.  Symptoms improved.      In February 2022 while in [place], he had onset of right hand numbness and weakness.  The following morning he had slurred speech so went to local hospital.  CT head/angiogram showed right carotid stenosis and left carotid occlusion.  MRI head showed small acute infarcts left insular cortex and in the external capsule.  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  He was told it was too small to be concerned about.  Aneurysm on CTA at right MCA bifurcation of 2.5 mm.      CT angiogram head August 21, 2020: Occluded left internal carotid artery at the origin.  Retrograde flow into the distal ICA with patent flow in the left ophthalmic artery.  Less than 50% stenosis right internal carotid.  Antegrade flow both vertebral arteries.    He denies any residual weakness or numbness post stroke.  He recovered to near baseline by time of discharge in February.    Recent hospitalization last month for STEMI.  He had severe back and neck pain.  There was some shoulder pain.  He did not notice chest pain.    He had stent to the left circumflex.  Plan was set for CABG to the LAD.     He had right rotator cuff surgery I n10/2022.  He is in RUE brace today.      Recent A1c - 6.o, LDL 43, TC 90  Echo EF 65%, segmental wall motion abnormalities    He was referred for neurologic evaluation prior to planned surgery for clearance. He has no complaints today.       Review of Systems   Constitutional: Negative.    HENT: Positive for sneezing.    Eyes: Negative.    Respiratory: Negative.    Cardiovascular: Negative.    Gastrointestinal: Positive for abdominal distention.   Endocrine: Positive for polydipsia and polyuria.   Genitourinary: Negative.    Musculoskeletal: Positive for back pain, neck pain and neck stiffness.   Skin: Negative.    Allergic/Immunologic: Positive for environmental allergies.   Neurological: Negative.    Hematological: Negative.    Psychiatric/Behavioral: Negative.    All other systems reviewed and are negative.      Chief Complaint:  Chief Complaint   Patient presents with   ? New Patient     Hx of CVA       Past Medical History:  Medical History:   Diagnosis Date   ? Aneurysm (HCC)    ?  Arthritis    ? Carotid artery disease (HCC)     CTO Left, and 50% blockage on Right carotid   ? Cerebral aneurysm    ? Coronary artery disease    ? Diverticulitis    ? Gout    ? Heart attack (HCC)    ? High cholesterol    ? HX: anticoagulation    ? Hypertension    ? Neuropathy    ? OSA on CPAP    ? Other specified disorders of arteries and arterioles (HCC)    ? Seasonal allergic reaction    ? Stroke [facility]) 2014   ? Stroke [facility]) 06/2020   ? Umbilical hernia        Surgical History:  Surgical History:   Procedure Laterality Date   ? HX EAR TUBES  02/25/2016   ? right shoulder arthroscopy, rotator cuff repair Right 02/28/2021    Performed by Dolan Amen, MD at [facility]   ? right shoulder arthroscopy,  distal clavicle excision Right 02/28/2021    Performed by Dolan Amen, MD at [facility]   ? right shoulder arthroscopy,  biceps tenotomy Right 02/28/2021    Performed by Dolan Amen, MD at [facility]   ? CARDIAC CATHERIZATION     ? COLECTOMY      colon resection   ? COLONOSCOPY     ? HX ADENOIDECTOMY     ? HX HEART CATHETERIZATION     ? HX TONSILLECTOMY         Social History:   Social History     Socioeconomic History   ? Marital status: Married   Tobacco Use   ? Smoking status: Never   ? Smokeless tobacco: Never   Substance and Sexual Activity   ? Alcohol use: Yes     Alcohol/week: 1.0 standard drink     Types: 1 Cans of beer per week     Comment: 1 drink per week   ? Drug use: No             Family History:  Family History   Problem Relation Age of Onset   ? COPD Mother    ? High Cholesterol Father    ? Heart Attack Brother    ? Heart Disease Brother         s/p PCI with stent placement   ? Heart Disease Maternal Grandfather    ? Diabetes Daughter    ? Crohn's Disease Daughter        Allergies:  Allergies   Allergen Reactions   ? Doxycycline MUSCLE PAIN   ? Colchicine DIARRHEA and NAUSEA ONLY     Body aches, diarrhea, nausea   ? Latex ITCHING, SEE COMMENTS and REDNESS     With prolonged exposure   ? Levaquin [Levofloxacin] SEE COMMENTS     Body aches and shakes   ? Plavix [Clopidogrel] SEE COMMENTS     Joint pain and muscle pain   ? Sulfa (Sulfonamide Antibiotics) ITCHING       Objective:         ? allopurinoL (ZYLOPRIM) 100 mg tablet Take 200 mg by mouth at bedtime daily. Take with food.   ? allopurinoL (ZYLOPRIM) 300 mg tablet Take 300 mg by mouth daily. Take with food.   ? aspirin EC 81 mg tablet Take 81 mg by mouth daily. Take with food.   ? coQ10 (ubiquinol) 100 mg cap Take 1 capsule by mouth at bedtime daily.   ?  fexofenadine (ALLEGRA) 180 mg tablet Take 180 mg by mouth daily.   ? furosemide (LASIX) 20 mg tablet Take one tablet by mouth daily as needed. Take one tablet if you gain 2 pounds in 1 day or 5 pounds in one week. You may additionally take if you become more short of breath or notice swelling in your lower legs.   ? glucosamine-D3-Boswellia serr 1,500-400-100 mg-unit-mg tab Take 1 tablet by mouth daily.   ? MEN'S MULTI-VITAMIN PO Take 1 tablet by mouth daily.   ? metoprolol tartrate 25 mg tablet Take one-half tablet by mouth twice daily.   ? nitroglycerin (NITROSTAT) 0.4 mg tablet Place one tablet under tongue every 5 minutes as needed for Chest Pain. Max of 3 tablets, call 911.   ? Potassium Gluconate 595 mg (99 mg) tab Take 595 mg by mouth daily.   ? rosuvastatin (CRESTOR) 20 mg tablet Take 20 mg by mouth at bedtime daily.   ? sodium chloride (SEA MIST) 0.65 % nasal spray Apply 2 sprays to each nostril as directed as Needed.   ? sour cherry extract (TART CHERRY EXTRACT) 1,000 mg cap Take 1 capsule by mouth daily.   ? ticagrelor (BRILINTA) 90 mg tablet Take one tablet by mouth twice daily.   ? traMADoL (ULTRAM) 50 mg tablet Take one tablet by mouth every 6-8 hours as needed for Pain. Indications: pain   ? triamcinolone (NASACORT) 55 mcg nasal inhaler Apply 2 sprays to each nostril as directed daily.     Vitals:    04/02/21 0743   BP: (!) 156/88   BP Source: Arm, Left Upper   Pulse: 59   Temp: 36.6 ?C (97.8 ?F)   Resp: 14   SpO2: 98%   TempSrc: Oral   PainSc: Three   Weight: 90.7 kg (200 lb)   Height: 170.2 cm (5' 7)     Body mass index is 31.32 kg/m?Marland Kitchen       Physical Exam    General: WG/WN/WD, ASA, attention appropriate, calm, cooperative demeanor, appropriately follows commands  RUE in brace from recent RC surgery  HEENT: NC/AT  Cardiac: RRR without murmur  Neck: supple, full ROM, no carotid bruit    Fundoscopy: clear, sharp disc margins, no papilledema OU  Speech: fluent, no dysarthria or aphasia  Mental status: Alert, oriented x 4, NAD    CN: VFF without cut/hemianopsia, PERRL, EOMI without restriction or nystagmus, facial sensation symmetric (V1-V3) to LT bilaterally, No facial droop or ptosis, hearing equal to FR, palatal rise symmetric,  shoulder shrug symmetric, tongue midline    Strength:   UEs: unable to assess RUE proximally in brace (postop); SA/EF/EE -/5, WF/WE 5/5  LEs: 5/5 HF/KE/DF/PF  No involuntary movements, no tremor    Sensation: LT symmetric in all limbs, no dermatomal deficit    DTRs: 2/2 in BR/B 1/1P/A, downgoing plantar reflexes bilaterally    Coordination: unable to assess RUE, normal on left  Gait: normal primary base and station, no ataxia         Assessment and Plan:    1. Coronary artery disease - planned CABG of LAD in 05/2021  2. 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 Recent NSTEMI s/p PCI  5. Obstructive sleep apnea  6. Hyperlipidemia well controlled on statin  7. Left carotid occlusion  8. Asymptomatic right carotid artery stenosis  9. Right rotator cuff repair 02/2021    Plan:  1. Explained clinical  scenario to patient.  He has significant risk factors for recurrent stroke including hypertension, myocardial infarction, prior stroke x2, carotid stenosis/cerebrovascular disease, hyperlipidemia and sleep apnea.  Management should focus on conservative risk factor reduction.  2.  Performance of CABG rests with risk versus benefit determination.  There is inherent perioperative stroke risk due to cerebrovascular/carotid disease and hemodynamic fluctuation.  owever, this is likely outweighed by need for coronary intervention.  Decision on procedure will be made in consultation with his cardiothoracic surgeon.  I have explained neurologic risk but that there is obvious cardiac risk if not done.  3.  I recommend maintaining SBP 140-150 to ensure cerebral perfusion (especially with known carotid disease).  4.  Small intracranial aneurysm is incidental and is of likely low risk perioperatively  5.  Right carotid stenosis is asymptomatic, so I would pursue no intervention for it now.  I would not delay coronary intervention with vascular procedure for an asymptomatic carotid stenosis.  However, I explained the potential risk associated perioperatively with left carotid occlusion and right asymptomatic carotid stenosis.  6.  Maintain LDL cholesterol less than 70 and A1c less than 6.5  7.  Continue aspirin 81 mg daily  8.  No indication neurologically for dual antiplatelet therapy.  However, he likely needs Brilinta also from a cardiovascular standpoint post stent  9.  No other neurologic recommendations.  We discussed at length inherent risk with and without surgery.  I have no neurologic objections to proceeding with CABG with the understanding that there is always procedural risk.  However, there is obvious risk if he does not have procedure done also.    Total Time Today was 60 minutes in the following activities: Preparing to see the patient, Obtaining and/or reviewing separately obtained history, Performing a medically appropriate examination and/or evaluation, Counseling and educating the patient/family/caregiver and Documenting clinical information in the electronic or other health record

## 2021-04-05 ENCOUNTER — Encounter: Admit: 2021-04-05 | Discharge: 2021-04-05 | Payer: MEDICARE

## 2021-04-09 ENCOUNTER — Encounter: Admit: 2021-04-09 | Discharge: 2021-04-09 | Payer: MEDICARE

## 2021-04-09 NOTE — Progress Notes
Reviewed neuro consult with Dr. Samule Ohm on this date. Dr. Samule Ohm okay to proceed on 05/29/21.

## 2021-04-15 ENCOUNTER — Encounter: Admit: 2021-04-15 | Discharge: 2021-04-15 | Payer: MEDICARE

## 2021-04-16 ENCOUNTER — Encounter: Admit: 2021-04-16 | Discharge: 2021-04-16 | Payer: MEDICARE

## 2021-04-16 ENCOUNTER — Ambulatory Visit: Admit: 2021-04-16 | Discharge: 2021-04-16 | Payer: MEDICARE

## 2021-04-16 DIAGNOSIS — G629 Polyneuropathy, unspecified: Secondary | ICD-10-CM

## 2021-04-16 DIAGNOSIS — I779 Disorder of arteries and arterioles, unspecified: Secondary | ICD-10-CM

## 2021-04-16 DIAGNOSIS — Z9889 Other specified postprocedural states: Secondary | ICD-10-CM

## 2021-04-16 DIAGNOSIS — I671 Cerebral aneurysm, nonruptured: Secondary | ICD-10-CM

## 2021-04-16 DIAGNOSIS — I729 Aneurysm of unspecified site: Secondary | ICD-10-CM

## 2021-04-16 DIAGNOSIS — G4733 Obstructive sleep apnea (adult) (pediatric): Secondary | ICD-10-CM

## 2021-04-16 DIAGNOSIS — M109 Gout, unspecified: Secondary | ICD-10-CM

## 2021-04-16 DIAGNOSIS — M1990 Unspecified osteoarthritis, unspecified site: Secondary | ICD-10-CM

## 2021-04-16 DIAGNOSIS — K429 Umbilical hernia without obstruction or gangrene: Secondary | ICD-10-CM

## 2021-04-16 DIAGNOSIS — I10 Essential (primary) hypertension: Secondary | ICD-10-CM

## 2021-04-16 DIAGNOSIS — I7789 Other specified disorders of arteries and arterioles: Secondary | ICD-10-CM

## 2021-04-16 DIAGNOSIS — I639 Cerebral infarction, unspecified: Secondary | ICD-10-CM

## 2021-04-16 DIAGNOSIS — E7800 Pure hypercholesterolemia, unspecified: Secondary | ICD-10-CM

## 2021-04-16 DIAGNOSIS — I2510 Atherosclerotic heart disease of native coronary artery without angina pectoris: Secondary | ICD-10-CM

## 2021-04-16 DIAGNOSIS — Z9229 Personal history of other drug therapy: Secondary | ICD-10-CM

## 2021-04-16 DIAGNOSIS — I219 Acute myocardial infarction, unspecified: Secondary | ICD-10-CM

## 2021-04-16 DIAGNOSIS — J302 Other seasonal allergic rhinitis: Secondary | ICD-10-CM

## 2021-04-16 DIAGNOSIS — K5792 Diverticulitis of intestine, part unspecified, without perforation or abscess without bleeding: Secondary | ICD-10-CM

## 2021-04-16 MED ORDER — TRAMADOL 50 MG PO TAB
50 mg | ORAL_TABLET | ORAL | 0 refills | PRN
Start: 2021-04-16 — End: ?

## 2021-04-16 NOTE — Progress Notes
CC:  Status post Right shoulder arthroscopy with rotator cuff repair, biceps tenodesis, and distal clavicle excision     HPI:  Ronnie Williams presents today for routine 6 week post-operative follow up of his right shoulder arthroscopy.   He is doing well with minimal complaints.  Patient still attending formal physical therapy for his right shoulder.  He will undergo CABG surgery in approximately 6 weeks.  He continues to wear his sling at this point.  Patient still has some nighttime discomfort in his taking an occasional tramadol for this.  Overall patient feels like he is making improvements.  No other major complaints or concerns today.    Physical Exam:      Examination of the patient's right shoulder reveals well-appearing shoulder.  No signs of infection.  Skin is intact.  All incisions are well-healed and benign.  Patient's range of motion today measures approximately 90 degrees in forward flexion, 85 degrees with abduction, 30 degrees internal and external rotation at 85 degrees abduction.  No strength testing was done today.  Neurovascular status to the right upper extremity is intact.    Impression: 65 y.o. male 6-weeks status post the above surgery doing well.    Plan:  He may progress through the PT protocol as planned (rotator cuff).  We discussed postoperative precautions moving forward. Restrictions discussed. I will have him see Dr. Royann Shivers in 6 weeks.  All of his questions were answered today.  He was comfortable with the plan at the end of the visit.      There were no vitals taken for this visit.    No orders of the defined types were placed in this encounter.      Current Medications:  ? allopurinoL (ZYLOPRIM) 100 mg tablet Take 200 mg by mouth at bedtime daily. Take with food.   ? allopurinoL (ZYLOPRIM) 300 mg tablet Take 300 mg by mouth daily. Take with food.   ? aspirin EC 81 mg tablet Take 81 mg by mouth daily. Take with food.   ? coQ10 (ubiquinol) 100 mg cap Take 1 capsule by mouth at bedtime daily.   ? fexofenadine (ALLEGRA) 180 mg tablet Take 180 mg by mouth daily.   ? furosemide (LASIX) 20 mg tablet Take one tablet by mouth daily as needed. Take one tablet if you gain 2 pounds in 1 day or 5 pounds in one week. You may additionally take if you become more short of breath or notice swelling in your lower legs.   ? glucosamine-D3-Boswellia serr 1,500-400-100 mg-unit-mg tab Take 1 tablet by mouth daily.   ? MEN'S MULTI-VITAMIN PO Take 1 tablet by mouth daily.   ? metoprolol tartrate 25 mg tablet Take one-half tablet by mouth twice daily.   ? nitroglycerin (NITROSTAT) 0.4 mg tablet Place one tablet under tongue every 5 minutes as needed for Chest Pain. Max of 3 tablets, call 911.   ? Potassium Gluconate 595 mg (99 mg) tab Take 595 mg by mouth daily.   ? rosuvastatin (CRESTOR) 20 mg tablet Take 20 mg by mouth at bedtime daily.   ? sodium chloride (SEA MIST) 0.65 % nasal spray Apply 2 sprays to each nostril as directed as Needed.   ? sour cherry extract (TART CHERRY EXTRACT) 1,000 mg cap Take 1 capsule by mouth daily.   ? ticagrelor (BRILINTA) 90 mg tablet Take one tablet by mouth twice daily.   ? traMADoL (ULTRAM) 50 mg tablet Take one tablet by mouth every 6-8 hours as  needed for Pain. Indications: pain   ? triamcinolone (NASACORT) 55 mcg nasal inhaler Apply 2 sprays to each nostril as directed daily.     Allergies   Allergen Reactions   ? Doxycycline MUSCLE PAIN   ? Colchicine DIARRHEA and NAUSEA ONLY     Body aches, diarrhea, nausea   ? Latex ITCHING, SEE COMMENTS and REDNESS     With prolonged exposure   ? Levaquin [Levofloxacin] SEE COMMENTS     Body aches and shakes   ? Plavix [Clopidogrel] SEE COMMENTS     Joint pain and muscle pain   ? Sulfa (Sulfonamide Antibiotics) ITCHING

## 2021-04-16 NOTE — Telephone Encounter
Called patient to discuss MyChart message. Patient will be having cardiac surgery in Jan. Asked him to reach back out then to talk about getting set up again for the [facility]. Patient was agreeable.

## 2021-04-16 NOTE — Patient Instructions
J. Paul Schroeppel, MD  Seth Morgan, PA-C  [facility] - Phone [phone] - Fax [phone]   [address], [address] - [place], [place] [address]  Guyla Bless, RN - Clinical Nurse Coordinator  Drew Hutchison, ATC - Clinical Athletic Trainer    Please call [phone] for follow up appointments For up to date information on the COVID-19 virus, visit the CDC website. https://www.cdc.gov/coronavirus   General supportive care during cold and flu season and infection prevention reminders:    o Wash hands often with soap and water for at least 20 seconds   o Cover your mouth and nose   o Social distancing: try to maintain 6 feet between you and other people   o Stay home if sick and symptoms mild or manageable?  If you must be around people wear a mask     If you are having symptoms of a lower respiratory infection (cough, shortness of breath) and/or fever AND either traveled in last 30 days (internationally or to region of exposure) OR known exposure to patient with COVID19:     o Call your primary care provider for questions or health needs.   Tell your doctor about your recent travel and your symptoms     o In a medical emergency, call 911 or go to the nearest emergency room.

## 2021-04-17 ENCOUNTER — Encounter: Admit: 2021-04-17 | Discharge: 2021-04-17 | Payer: MEDICARE

## 2021-04-17 DIAGNOSIS — I25118 Atherosclerotic heart disease of native coronary artery with other forms of angina pectoris: Secondary | ICD-10-CM

## 2021-04-17 DIAGNOSIS — I213 ST elevation (STEMI) myocardial infarction of unspecified site: Secondary | ICD-10-CM

## 2021-04-21 ENCOUNTER — Inpatient Hospital Stay: Admit: 2021-04-21 | Discharge: 2021-04-21 | Payer: MEDICARE

## 2021-04-21 DIAGNOSIS — I25118 Atherosclerotic heart disease of native coronary artery with other forms of angina pectoris: Secondary | ICD-10-CM

## 2021-04-21 DIAGNOSIS — I213 ST elevation (STEMI) myocardial infarction of unspecified site: Secondary | ICD-10-CM

## 2021-05-16 ENCOUNTER — Encounter: Admit: 2021-05-16 | Discharge: 2021-05-16 | Payer: MEDICARE

## 2021-05-26 ENCOUNTER — Encounter: Admit: 2021-05-26 | Discharge: 2021-05-26 | Payer: MEDICARE

## 2021-05-26 ENCOUNTER — Ambulatory Visit: Admit: 2021-05-26 | Discharge: 2021-05-26 | Payer: MEDICARE

## 2021-05-26 DIAGNOSIS — M545 Low back pain, unspecified: Principal | ICD-10-CM

## 2021-05-26 NOTE — Patient Instructions
J. Paul Schroeppel, MD  Seth Morgan, PA-C  [facility] - Phone [phone] - Fax [phone]   [address], [address] - [place], [place] [address]  Erika Beal, RN - Clinical Nurse Coordinator  Drew Hutchison, ATC - Clinical Athletic Trainer    Please call [phone] for follow up appointments

## 2021-05-28 ENCOUNTER — Encounter: Admit: 2021-05-28 | Discharge: 2021-05-28 | Payer: MEDICARE

## 2021-05-28 DIAGNOSIS — I2510 Atherosclerotic heart disease of native coronary artery without angina pectoris: Secondary | ICD-10-CM

## 2021-05-28 MED ORDER — LIDOCAINE (PF) 10 MG/ML (1 %) IJ SOLN
.2 mL | INTRAMUSCULAR | 0 refills | PRN
Start: 2021-05-28 — End: ?

## 2021-05-28 MED ORDER — SODIUM CHLORIDE 0.9 % IV SOLP
INTRAVENOUS | 0 refills
Start: 2021-05-28 — End: ?

## 2021-06-04 ENCOUNTER — Encounter: Admit: 2021-06-04 | Discharge: 2021-06-04 | Payer: MEDICARE

## 2021-06-04 ENCOUNTER — Ambulatory Visit: Admit: 2021-06-04 | Discharge: 2021-06-04 | Payer: MEDICARE

## 2021-06-04 DIAGNOSIS — K5792 Diverticulitis of intestine, part unspecified, without perforation or abscess without bleeding: Secondary | ICD-10-CM

## 2021-06-04 DIAGNOSIS — I2510 Atherosclerotic heart disease of native coronary artery without angina pectoris: Secondary | ICD-10-CM

## 2021-06-04 DIAGNOSIS — Z9229 Personal history of other drug therapy: Secondary | ICD-10-CM

## 2021-06-04 DIAGNOSIS — E7800 Pure hypercholesterolemia, unspecified: Secondary | ICD-10-CM

## 2021-06-04 DIAGNOSIS — Z8249 Family history of ischemic heart disease and other diseases of the circulatory system: Secondary | ICD-10-CM

## 2021-06-04 DIAGNOSIS — I10 Essential (primary) hypertension: Secondary | ICD-10-CM

## 2021-06-04 DIAGNOSIS — G4733 Obstructive sleep apnea (adult) (pediatric): Secondary | ICD-10-CM

## 2021-06-04 DIAGNOSIS — J302 Other seasonal allergic rhinitis: Secondary | ICD-10-CM

## 2021-06-04 DIAGNOSIS — Z9889 Other specified postprocedural states: Secondary | ICD-10-CM

## 2021-06-04 DIAGNOSIS — M1990 Unspecified osteoarthritis, unspecified site: Secondary | ICD-10-CM

## 2021-06-04 DIAGNOSIS — I219 Acute myocardial infarction, unspecified: Secondary | ICD-10-CM

## 2021-06-04 DIAGNOSIS — I6522 Occlusion and stenosis of left carotid artery: Secondary | ICD-10-CM

## 2021-06-04 DIAGNOSIS — I2121 ST elevation (STEMI) myocardial infarction involving left circumflex coronary artery: Secondary | ICD-10-CM

## 2021-06-04 DIAGNOSIS — Z955 Presence of coronary angioplasty implant and graft: Secondary | ICD-10-CM

## 2021-06-04 DIAGNOSIS — K429 Umbilical hernia without obstruction or gangrene: Secondary | ICD-10-CM

## 2021-06-04 DIAGNOSIS — M109 Gout, unspecified: Secondary | ICD-10-CM

## 2021-06-04 DIAGNOSIS — Z01818 Encounter for other preprocedural examination: Secondary | ICD-10-CM

## 2021-06-04 DIAGNOSIS — I729 Aneurysm of unspecified site: Secondary | ICD-10-CM

## 2021-06-04 DIAGNOSIS — I639 Cerebral infarction, unspecified: Secondary | ICD-10-CM

## 2021-06-04 DIAGNOSIS — G629 Polyneuropathy, unspecified: Secondary | ICD-10-CM

## 2021-06-04 DIAGNOSIS — I671 Cerebral aneurysm, nonruptured: Secondary | ICD-10-CM

## 2021-06-04 DIAGNOSIS — I7789 Other specified disorders of arteries and arterioles: Secondary | ICD-10-CM

## 2021-06-04 DIAGNOSIS — Z6832 Body mass index (BMI) 32.0-32.9, adult: Secondary | ICD-10-CM

## 2021-06-04 DIAGNOSIS — I779 Disorder of arteries and arterioles, unspecified: Secondary | ICD-10-CM

## 2021-06-04 LAB — COMPREHENSIVE METABOLIC PANEL
ALBUMIN: 4.5 g/dL (ref 3.5–5.0)
ALK PHOSPHATASE: 57 U/L (ref 25–110)
ALT: 11 U/L (ref 7–56)
ANION GAP: 8 (ref 3–12)
AST: 18 U/L (ref 7–40)
CALCIUM: 9.4 mg/dL (ref 8.5–10.6)
CO2: 28 MMOL/L (ref 21–30)
CREATININE: 1 mg/dL (ref 0.4–1.24)
EGFR: >60 mL/min (ref >60)
POTASSIUM: 4.4 MMOL/L (ref 3.5–5.1)
SODIUM: 142 MMOL/L (ref 137–147)
TOTAL BILIRUBIN: 0.5 mg/dL (ref 0.3–1.2)
TOTAL PROTEIN: 7.3 g/dL — ABNORMAL LOW (ref 6.0–8.0)

## 2021-06-04 LAB — PROTIME INR (PT)
INR: 1 mg/dL — ABNORMAL HIGH (ref 0.8–1.2)
PROTIME: 11 s (ref 9.5–14.2)

## 2021-06-04 LAB — HEMOGLOBIN A1C: HEMOGLOBIN A1C: 6.1 % — ABNORMAL HIGH (ref 4.0–6.0)

## 2021-06-04 LAB — CBC
RBC COUNT: 4.3 M/UL — ABNORMAL LOW (ref 4.4–5.5)
WBC COUNT: 5.4 K/UL (ref 4.5–11.0)

## 2021-06-04 NOTE — Research Notes
Patient: Ronnie Williams  DOB: 1956-01-17  MRN: [id number]      Informed consent was obtained subsequent to receipt of the letter of approval from the [employer]. Informed consent was obtained prior to performance of any procedures as outlined in the study protocol.   A thorough screening of the participant's chart was completed to identify inclusion criteria per the study protocol and the participant met the criteria to the best of my knowledge. No exclusions to the study protocol were found and the participant met all the study enrollment requirements to the best of my knowledge.   The details of the protocol were discussed with the participant. The study purpose, procedures, risk/benefits and alternatives were discussed with the participant. The participant voiced understanding that participation is voluntary and that he could withdraw at any time. Time was given for the participant to read the consent form and ask questions. The participant's questions were answered to his/her satisfaction and the participant signed the consent form. The participant verbalized full understanding of all the details of the consent. A copy of the signed consent form was placed in the chart and a copy was given to the participant for future reference and contacts regarding participation.  the consent form discussed above was approved by the [facility] IRB and the [employer].        Study: [employer]  PI:  Erle Crocker, MD  Coordinator:  Loman Chroman  HSC#: [id number]  [facility] protocol #:  [id number]   ICF signed: 06/04/2021

## 2021-06-04 NOTE — Progress Notes
Date of Service: 06/04/2021       Subjective:             Taijuan Ronnie Williams is a 66 y.o. male.      History of Present Illness  We had the pleasure of seeing Justyce Merfeld in clinic today for an updated H&P prior to his CABG operation for his CAD scheduled for first case tomorrow. Brig Katzer is a 66 y.o. male whom we met in the hospital on 03/04/2021.  He underwent a rotator cuff surgery on 02/28/2021 and reported to an outside hospital on 03/04/2021 with severe chest pain.  His troponin was 17.5 and he was transferred to [facility] for a STEMI and was taken to the Cath Lab for left heart cath.  He had a PCI with drug-eluting stent placed to the left circumflex.  We were consulted at that time for CABG evaluation.  He had residual LAD and diagonal disease and had been started on Brilinta for his PCI.  We asked that he see [place] in clinic on 03/26/21 to rediscuss surgical intervention for his coronary artery disease. At that visit we decided to delay surgical intervention until he could be on Brilinta for 3 months and out of his shoulder immobilizer. Today he states he had the shoulder immobilizer removed about 6 weeks ago and he has no issues with his shoulder or arm at this time. He stopped his Brilinta 5 days ago as instructed by our office. He met with pre anesthesia testing today and with our pre-op nurse.He states overall he has remained more fatigued and requires a midday nap. He denies any hospitalizations, procedures, surgeries, major illness, or injury since we last saw him.  ?  His current medical history includes CVA x2 with the first 1 in 2014 and seen at [facility]. His second CVA was in February 2022 while he was in [place].  He does not have any residual from either stroke.  While he was in [place] he was found to have significant carotid artery disease.  He established care with neurology and vascular surgery in [place].  During that evaluation he was informed of having a cerebral aneurysm and has been told there is no intervention for this.  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  His other current medical history includes diverticulitis status post colon resection, gout, hypertension, obstructive sleep apnea on CPAP, umbilical hernia. Worth noting, his family history is significant for a brother having an MI at the age of 47 and another brother having blockages requiring intervention.   ?  He had an outside carotid ultrasound on 01/01/2021 that showed:  Conclusions: <50% right internal carotid artery stenosis.   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   Antegrade flow in the vertebral arteries.  ?  He had a CTA of the chest on 03/04/2021 that showed:  No pulmonary embolism given limitations of exam.   Mild lung edema. Peribronchial and dependent consolidation in the lower lobes is likely in large part due to atelectasis. Aspiration could appear similar.   Mild cardiomegaly and marked coronary artery calcification.   Age indeterminate mild superior endplate compression deformity of T8.   ?  He had an echocardiogram on 03/04/2021 that showed:  ? The left ventricular size is normal. Normal geometry. The left ventricular systolic function is normal. The visually estimated ejection fraction is 65%. There are segmental wall motion abnormalities, as coded in  the diagram below  ? The right ventricular size is normal. The right ventricular systolic function is normal.  ? Mild biatrial enlargement.  ? No significant valvular abnormalities.  ? Estimated Peak Systolic PA Pressure 31 mmHg  ? No pericardial effusion.  ?  He had a left heart catheterization on 03/04/2021 that showed:  1. The mid left circumflex artery has a 99% stenosis. ?This is the culprit lesion for the patient's current presentation.  2. The ostium of the left anterior descending artery has a 70% stenosis. ?The mid left anterior descending artery has 60% to 70% long segment of stenosis.  3. The 2nd diagonal branch has 70% to 80% stenosis in the proximal segment.  4. Elevated left ventricular end-diastolic pressure.  5. No significant gradient across the aortic valve on pullback.  6. Successful percutaneous coronary intervention of the mid left circumflex artery with 4.0 x 15 mm Onyx Frontier drug-eluting stent postdilated with 4.5 NC.  7. Measurement of RFR across the left anterior descending artery was 0.63 suggesting that the stenosis is hemodynamically significant.  8. Intravascular ultrasound of the left main coronary artery revealed minimal lumen area of 20.8 sq mm.  ?  He had a chest x-ray on 03/04/2021 that showed:  Cardiac silhouette enlargement with findings suggestive of pulmonary edema. Infection or aspiration not excluded in the left perihilar and basal region given some nodularity. Follow-up radiographs suggested.?  ?  He had vein mapping on 03/04/2021 that showed:  9. Normal compression and no visualized thrombus in the measured venous segments  10. Right GSV is suitable for conduit from the proximal thigh to the knee and then tapers distally  11. Left GSV is suitable for conduit from the proximal thigh to the proximal calf and then tapers  12. Detailed measurements are below  ?  His PFTs today showed an FEV1 of 107% predicted and a DLCO of 106% predicted. ?      This was all reviewed today by Dr. Elvis Coil and was discussed at length with Ephriam Jenkins.          Review of Systems   Constitutional: Negative.   HENT: Negative.    Eyes: Negative.    Cardiovascular: Negative.    Respiratory: Negative.    Endocrine: Negative.    Hematologic/Lymphatic: Negative.    Skin: Negative.    Musculoskeletal: Negative.    Gastrointestinal: Negative.    Genitourinary: Negative.    Neurological: Negative.    Psychiatric/Behavioral: Negative.    Allergic/Immunologic: Negative.      Medical History:   Diagnosis Date   ? Aneurysm (HCC)    ? Arthritis    ? Carotid artery disease (HCC)     CTO Left, and 50% blockage on Right carotid   ? Cerebral aneurysm    ? Coronary artery disease    ? Diverticulitis    ? Gout    ? Heart attack (HCC)    ? High cholesterol    ? HX: anticoagulation    ? Hypertension    ? Neuropathy    ? OSA on CPAP    ? Other specified disorders of arteries and arterioles (HCC)    ? Seasonal allergic reaction    ? Stroke [facility]) 2014   ? Stroke [facility]) 06/2020   ? Umbilical hernia      Surgical History:   Procedure Laterality Date   ? COLECTOMY  2007    colon resection   ? HX EAR TUBES  02/25/2016   ?  right shoulder arthroscopy, rotator cuff repair Right 02/28/2021    Performed by Dolan Amen, MD at [facility]   ? right shoulder arthroscopy,  distal clavicle excision Right 02/28/2021    Performed by Dolan Amen, MD at [facility]   ? right shoulder arthroscopy,  biceps tenotomy Right 02/28/2021    Performed by Dolan Amen, MD at [facility]   ? HX HEART CATHETERIZATION  03/04/2021    stent x2 placed   ? COLONOSCOPY     ? HX ADENOIDECTOMY     ? HX TONSILLECTOMY       Social History     Socioeconomic History   ? Marital status: Married   Tobacco Use   ? Smoking status: Never   ? Smokeless tobacco: Never   Substance and Sexual Activity   ? Alcohol use: Yes     Alcohol/week: 1.0 standard drink     Types: 1 Cans of beer per week     Comment: 1 drink per week   ? Drug use: No     Family History   Problem Relation Age of Onset   ? COPD Mother    ? High Cholesterol Father    ? Heart Attack Brother    ? Heart Disease Brother         s/p PCI with stent placement   ? Heart Disease Maternal Grandfather    ? Diabetes Daughter    ? Crohn's Disease Daughter          Objective:         ? allopurinoL (ZYLOPRIM) 100 mg tablet Take 200 mg by mouth at bedtime daily. Take with food.   ? allopurinoL (ZYLOPRIM) 300 mg tablet Take 300 mg by mouth daily. Take with food.   ? aspirin EC 81 mg tablet Take 81 mg by mouth daily. Take with food.   ? coQ10 (ubiquinol) 100 mg cap Take 1 capsule by mouth at bedtime daily.   ? fexofenadine (ALLEGRA) 180 mg tablet Take 180 mg by mouth daily.   ? furosemide (LASIX) 20 mg tablet Take one tablet by mouth daily as needed. Take one tablet if you gain 2 pounds in 1 day or 5 pounds in one week. You may additionally take if you become more short of breath or notice swelling in your lower legs.   ? glucosamine-D3-Boswellia serr 1,500-400-100 mg-unit-mg tab Take 1 tablet by mouth daily.   ? MEN'S MULTI-VITAMIN PO Take 1 tablet by mouth daily.   ? metoprolol tartrate 25 mg tablet Take one-half tablet by mouth twice daily.   ? nitroglycerin (NITROSTAT) 0.4 mg tablet Place one tablet under tongue every 5 minutes as needed for Chest Pain. Max of 3 tablets, call 911.   ? olmesartan (BENICAR) 40 mg tablet Take 40 mg by mouth daily.   ? Potassium Gluconate 595 mg (99 mg) tab Take 595 mg by mouth daily.   ? rosuvastatin (CRESTOR) 20 mg tablet Take 20 mg by mouth at bedtime daily.   ? sodium chloride (SEA MIST) 0.65 % nasal spray Apply 2 sprays to each nostril as directed as Needed.   ? sour cherry extract (TART CHERRY EXTRACT) 1,000 mg cap Take 1 capsule by mouth daily.   ? ticagrelor (BRILINTA) 90 mg tablet Take one tablet by mouth twice daily. (Patient taking differently: Take 67.5 mg by mouth twice daily.)   ? triamcinolone (NASACORT) 55 mcg nasal inhaler Apply 2 sprays to each nostril as directed daily.  Vitals:    06/04/21 1252   BP: (!) 142/68   BP Source: Arm, Right Upper   Pulse: 64   SpO2: 98%   PainSc: Zero   Weight: 92.5 kg (204 lb)   Height: 170.2 cm (5' 7)     Body mass index is 31.95 kg/m?Marland Kitchen     Physical Exam  Constitutional:       Appearance: Normal appearance. He is normal weight.   Cardiovascular:      Rate and Rhythm: Normal rate and regular rhythm.      Pulses: Normal pulses.      Heart sounds: Normal heart sounds.   Pulmonary:      Effort: Pulmonary effort is normal.      Breath sounds: Normal breath sounds.   Abdominal:      Palpations: Abdomen is soft.   Musculoskeletal:         General: Normal range of motion.      Right lower leg: No edema.      Left lower leg: No edema.   Skin:     General: Skin is warm and dry.   Neurological:      Mental Status: He is alert and oriented to person, place, and time.   Psychiatric:         Mood and Affect: Mood normal.         Behavior: Behavior normal.         Thought Content: Thought content normal.         Judgment: Judgment normal.             Assessment:  1. Coronary artery disease involving native coronary artery of native heart, unspecified whether angina present    2. Acute ST elevation myocardial infarction (STEMI) involving left circumflex coronary artery (HCC)    3. ST elevation myocardial infarction involving left circumflex coronary artery (HCC)    4. S/P coronary artery stent placement    5. HX: anticoagulation    6. Cerebrovascular accident (CVA), unspecified mechanism (HCC)    7. Occlusion of left carotid artery    8. Family history of chronic ischemic heart disease    9. Cerebral aneurysm    10. Traumatic complete tear of right rotator cuff, initial encounter    11. S/P rotator cuff repair    12. Diverticulitis    13. BMI 32.0-32.9,adult    14. OSA (obstructive sleep apnea)    15. Essential hypertension    16. Pre-op evaluation        We will plan to proceed with CABG operation tomorrow (06/05/21) as first case. His work up is complete.    Jarome Lamas, APRN-NP  Thoracic & Cardiovascular Surgery  [phone]    06/04/2021        Assessment and Plan:  Examined in clinic again today. History above reviewed again. 67M PCI LCx on 03/04/21, has since recovered well. His cardiac work-up is again reviewed. He has a right carotid CTO, he has prior intracranial aneurysm for which he has seen Dr. Sunday Corn from Neurology team and it is considered a significant risk factor but this is outweighed by the risk of progression of his CAD, favoring proceeding with CABG, per their note. His LHC shows a tiny RCA, no target, a significant LAD stenosis with a reasonable distal target, diag disease, and the OM stent. His most recent TTE shows EF 65% but some inferior wall akinesis, related to the area of his known STEMI from 02/2021.  I have discussed the above with the patient again, including the risks of surgery, including death and stroke and respiratory failure, all of which are elevated in this patient, but it is thought that the risk of death from a cardiac related issue is far greater and we have offered CABG. The patient and his wife have both expressed full understanding of the risks of the procedure and have asked to proceed with surgery tomorrow AM.     Elvis Coil MD

## 2021-06-05 ENCOUNTER — Inpatient Hospital Stay: Admit: 2021-06-05 | Discharge: 2021-06-05 | Payer: MEDICARE

## 2021-06-05 ENCOUNTER — Encounter: Admit: 2021-06-05 | Discharge: 2021-06-05 | Payer: MEDICARE

## 2021-06-05 DIAGNOSIS — K429 Umbilical hernia without obstruction or gangrene: Secondary | ICD-10-CM

## 2021-06-05 DIAGNOSIS — I639 Cerebral infarction, unspecified: Secondary | ICD-10-CM

## 2021-06-05 DIAGNOSIS — M109 Gout, unspecified: Secondary | ICD-10-CM

## 2021-06-05 DIAGNOSIS — G629 Polyneuropathy, unspecified: Secondary | ICD-10-CM

## 2021-06-05 DIAGNOSIS — M1990 Unspecified osteoarthritis, unspecified site: Secondary | ICD-10-CM

## 2021-06-05 DIAGNOSIS — K5792 Diverticulitis of intestine, part unspecified, without perforation or abscess without bleeding: Secondary | ICD-10-CM

## 2021-06-05 DIAGNOSIS — I2510 Atherosclerotic heart disease of native coronary artery without angina pectoris: Secondary | ICD-10-CM

## 2021-06-05 DIAGNOSIS — I729 Aneurysm of unspecified site: Secondary | ICD-10-CM

## 2021-06-05 DIAGNOSIS — I10 Essential (primary) hypertension: Secondary | ICD-10-CM

## 2021-06-05 DIAGNOSIS — I779 Disorder of arteries and arterioles, unspecified: Secondary | ICD-10-CM

## 2021-06-05 DIAGNOSIS — I7789 Other specified disorders of arteries and arterioles: Secondary | ICD-10-CM

## 2021-06-05 DIAGNOSIS — G4733 Obstructive sleep apnea (adult) (pediatric): Secondary | ICD-10-CM

## 2021-06-05 DIAGNOSIS — I671 Cerebral aneurysm, nonruptured: Secondary | ICD-10-CM

## 2021-06-05 DIAGNOSIS — I219 Acute myocardial infarction, unspecified: Secondary | ICD-10-CM

## 2021-06-05 DIAGNOSIS — Z9229 Personal history of other drug therapy: Secondary | ICD-10-CM

## 2021-06-05 DIAGNOSIS — J302 Other seasonal allergic rhinitis: Secondary | ICD-10-CM

## 2021-06-05 DIAGNOSIS — E7800 Pure hypercholesterolemia, unspecified: Secondary | ICD-10-CM

## 2021-06-05 MED ORDER — ARTIFICIAL TEARS (PF) SINGLE DOSE DROPS GROUP
OPHTHALMIC | 0 refills | Status: DC
Start: 2021-06-05 — End: 2021-06-05
  Administered 2021-06-05: 14:00:00 2 [drp] via OPHTHALMIC

## 2021-06-05 MED ORDER — AUTOLOGOUS BLOOD (CELL SAVER)
INTRAVENOUS | 0 refills | Status: DC
Start: 2021-06-05 — End: 2021-06-05

## 2021-06-05 MED ORDER — PROPOFOL INJ 10 MG/ML IV VIAL
INTRAVENOUS | 0 refills | Status: DC
Start: 2021-06-05 — End: 2021-06-05
  Administered 2021-06-05: 13:00:00 90 mg via INTRAVENOUS
  Administered 2021-06-05: 17:00:00 110 mg via INTRAVENOUS

## 2021-06-05 MED ORDER — LIDOCAINE (PF) 20 MG/ML (2 %) IJ SOLN
INTRAVENOUS | 0 refills | Status: DC
Start: 2021-06-05 — End: 2021-06-05
  Administered 2021-06-05: 13:00:00 100 mg via INTRAVENOUS

## 2021-06-05 MED ORDER — FENTANYL CITRATE (PF) 50 MCG/ML IJ SOLN
INTRAVENOUS | 0 refills | Status: DC
Start: 2021-06-05 — End: 2021-06-05
  Administered 2021-06-05: 15:00:00 250 ug via INTRAVENOUS
  Administered 2021-06-05: 13:00:00 500 ug via INTRAVENOUS

## 2021-06-05 MED ORDER — SODIUM CHLORIDE 0.9 % IV SOLP
INTRAVENOUS | 0 refills | Status: DC
Start: 2021-06-05 — End: 2021-06-05
  Administered 2021-06-05: 14:00:00 via INTRAVENOUS

## 2021-06-05 MED ORDER — NITROGLYCERIN IN 5 % DEXTROSE 50 MG/250 ML (200 MCG/ML) IV SOLN (INFUSION)(AM)(OR)
INTRAVENOUS | 0 refills | Status: DC
Start: 2021-06-05 — End: 2021-06-05
  Administered 2021-06-05: 17:00:00 2 ug/kg/min via INTRAVENOUS

## 2021-06-05 MED ORDER — VECURONIUM BROMIDE 10 MG IV SOLR
INTRAVENOUS | 0 refills | Status: DC
Start: 2021-06-05 — End: 2021-06-05
  Administered 2021-06-05: 13:00:00 10 mg via INTRAVENOUS
  Administered 2021-06-05: 16:00:00 5 mg via INTRAVENOUS

## 2021-06-05 MED ORDER — DEXMEDETOMIDINE 4 MCG/ML (INFUSION)(AM)(OR)
INTRAVENOUS | 0 refills | Status: DC
Start: 2021-06-05 — End: 2021-06-05
  Administered 2021-06-05: 18:00:00 .7 ug/kg/h via INTRAVENOUS

## 2021-06-05 MED ORDER — CEFAZOLIN 1 GRAM IJ SOLR
INTRAVENOUS | 0 refills | Status: DC
Start: 2021-06-05 — End: 2021-06-05
  Administered 2021-06-05 (×2): 2 g via INTRAVENOUS

## 2021-06-05 MED ORDER — AMINOCAPROIC ACID 12.5 GM INFUSION (OR)
INTRAVENOUS | 0 refills | Status: DC
Start: 2021-06-05 — End: 2021-06-05
  Administered 2021-06-05 (×2): 2 g/h via INTRAVENOUS

## 2021-06-05 MED ORDER — ELECTROLYTE-A IV SOLP
INTRAVENOUS | 0 refills | Status: DC
Start: 2021-06-05 — End: 2021-06-05
  Administered 2021-06-05 (×2): via INTRAVENOUS

## 2021-06-05 MED ORDER — NOREPINEPHRINE IV DRIP STD CONC (AM)(OR)
INTRAVENOUS | 0 refills | Status: DC
Start: 2021-06-05 — End: 2021-06-05
  Administered 2021-06-05: 14:00:00 .04 ug/kg/min via INTRAVENOUS

## 2021-06-05 MED ORDER — AMINOCAPROIC ACID 250 MG/ML IV SOLN
INTRAVENOUS | 0 refills | Status: DC
Start: 2021-06-05 — End: 2021-06-05
  Administered 2021-06-05: 15:00:00 5 g via INTRAVENOUS

## 2021-06-05 MED ORDER — PROTAMINE 10 MG/ML IV SOLN
INTRAVENOUS | 0 refills | Status: DC
Start: 2021-06-05 — End: 2021-06-05
  Administered 2021-06-05 (×5): 20 mg via INTRAVENOUS
  Administered 2021-06-05: 18:00:00 40 mg via INTRAVENOUS
  Administered 2021-06-05: 17:00:00 20 mg via INTRAVENOUS
  Administered 2021-06-05: 17:00:00 10 mg via INTRAVENOUS
  Administered 2021-06-05: 18:00:00 30 mg via INTRAVENOUS
  Administered 2021-06-05 (×2): 20 mg via INTRAVENOUS

## 2021-06-05 MED ORDER — VASOPRESSIN 20 UNITS/20ML SYR (1 UNIT/ML) (AN) (OSM)
INTRAVENOUS | 0 refills | Status: DC
Start: 2021-06-05 — End: 2021-06-05
  Administered 2021-06-05 (×2): 2 [IU] via INTRAVENOUS

## 2021-06-05 MED ORDER — HEPARIN (PORCINE) 1,000 UNIT/ML IJ SOLN
INTRAVENOUS | 0 refills | Status: DC
Start: 2021-06-05 — End: 2021-06-05
  Administered 2021-06-05: 15:00:00 5000 [IU] via INTRAVENOUS
  Administered 2021-06-05: 16:00:00 35000 [IU] via INTRAVENOUS

## 2021-06-05 MED ORDER — PHENYLEPHRINE HCL IN 0.9% NACL 1 MG/10 ML (100 MCG/ML) IV SYRG
INTRAVENOUS | 0 refills | Status: DC
Start: 2021-06-05 — End: 2021-06-05
  Administered 2021-06-05 (×2): 200 ug via INTRAVENOUS

## 2021-06-05 MED ORDER — LIDOCAINE (PF) 10 MG/ML (1 %) IJ SOLN
SUBCUTANEOUS | 0 refills | Status: CP
Start: 2021-06-05 — End: ?
  Administered 2021-06-05: 13:00:00 .5 mL via SUBCUTANEOUS

## 2021-06-05 MED ORDER — NITROGLYCERIN 200MCG SYRINGE (OR)(OSM)
INTRAVENOUS | 0 refills | Status: DC
Start: 2021-06-05 — End: 2021-06-05
  Administered 2021-06-05 (×2): 40 ug via INTRAVENOUS

## 2021-06-05 MED ADMIN — POTASSIUM CHLORIDE IN WATER 10 MEQ/50 ML IV PGBK [11075]: 10 meq | INTRAVENOUS | @ 22:00:00 | Stop: 2022-06-05 | NDC 00338070541

## 2021-06-05 MED ADMIN — SODIUM CHLORIDE 0.9 % IV SOLP [27838]: 1000.000 mL | INTRAVENOUS | @ 13:00:00 | Stop: 2021-06-05 | NDC 00338004904

## 2021-06-05 MED ADMIN — SODIUM CHLORIDE 0.9 % IV SOLP [27838]: 5000 mL | @ 16:00:00 | Stop: 2021-06-05 | NDC 00338004904

## 2021-06-05 MED ADMIN — NICARDIPINE IN NACL (ISO-OS) 20 MG/200 ML (0.1 MG/ML) IV PGBK [169819]: 5 mg/h | INTRAVENOUS | @ 20:00:00 | NDC 43066000910

## 2021-06-05 MED ADMIN — PAPAVERINE 30 MG/ML IJ SOLN [6030]: 20 mL | @ 16:00:00 | Stop: 2021-06-05 | NDC 00517400225

## 2021-06-05 MED ADMIN — ACETAMINOPHEN 500 MG PO TAB [102]: 1000 mg | ORAL | @ 22:00:00 | Stop: 2021-06-09 | NDC 00904672080

## 2021-06-05 MED ADMIN — MAGNESIUM SULFATE IN WATER 4 GRAM/50 ML (8 %) IV PGBK [166563]: 4 g | INTRAVENOUS | @ 19:00:00 | Stop: 2021-06-05 | NDC 00264420552

## 2021-06-05 MED ADMIN — HEPARIN (PORCINE) 1,000 UNIT/ML IJ SOLN [10176]: 500 mL | @ 16:00:00 | Stop: 2021-06-05 | NDC 63323054011

## 2021-06-05 MED ADMIN — ELECTROLYTE-A IV ADDITIVE [211674]: 500 mL | @ 16:00:00 | Stop: 2021-06-05 | NDC 00338022104

## 2021-06-05 MED ADMIN — ALBUMIN, HUMAN 5 % IV SOLP [8982]: 250 mL | INTRAVENOUS | @ 19:00:00 | Stop: 2021-06-05 | NDC 68516521401

## 2021-06-05 MED ADMIN — FENTANYL CITRATE (PF) 50 MCG/ML IJ SOLN [3037]: 25 ug | INTRAVENOUS | @ 22:00:00 | Stop: 2021-06-06 | NDC 00409909412

## 2021-06-05 MED ADMIN — FENTANYL CITRATE (PF) 50 MCG/ML IJ SOLN [3037]: 50 ug | INTRAVENOUS | @ 20:00:00 | Stop: 2021-06-06 | NDC 00409909412

## 2021-06-05 MED ADMIN — OXYCODONE 5 MG PO TAB [10814]: 5 mg | ORAL | @ 22:00:00 | NDC 00406055223

## 2021-06-05 MED ADMIN — SODIUM CHLORIDE 0.9 % IV SOLP [27838]: 20 mL | @ 16:00:00 | Stop: 2021-06-05 | NDC 00264999900

## 2021-06-05 MED ADMIN — VANCOMYCIN 1,000 MG IV SOLR [8442]: 4 g | TOPICAL | @ 16:00:00 | Stop: 2021-06-05 | NDC 00409653311

## 2021-06-05 NOTE — Progress Notes
5 Meter walks complete:     1st Trial = 4.05 seconds  2nd Trial = 3.90 seconds  3rd Trial = 3.26 seconds

## 2021-06-05 NOTE — Patient Education
Pre-op education appt complete with Rocky Link and his wife [name], who will be w/ pt on DOS also.  Informed of current visitor policy. Reviewed and provided written pre-op instructions.  Instructed pt to be NPO at 2300 the noc before surgery, except for a sip of water to take any meds that the [facility] pharmacist instructs pt to take on the morning of surgery.  Verified that his LD of Brilinta was 1/13, as instructed.  As per written instructions, pt to take 2 preop showers w/ provided CHG 4% soap prior to coming to the hospital for surgery. Instructed to check in at [facility] on DOS at 0545. Discussed process for pre-op, intra-op and post-op recovery.  Discussed post-op pain; lifting and driving restrictions; importance of mobilization and s/s of infection after discharge.  Instructed to call the CTS office if having any health changes prior to surgery.  Pt verbalized understanding of all instructions.  Denies skin or dental issues; denies s/s UTI.  25M walks complete - see additional note.  Pt denies s/s of covid.  Preop labs drawn in CTS Lab.   Escorted pt to radiology for CXR.  All questions answered to the pt's satisfaction.  Escorted to [facility] Admitting for pre-registration and to [facility].   Earney Mallet

## 2021-06-06 ENCOUNTER — Inpatient Hospital Stay: Admit: 2021-06-06 | Discharge: 2021-06-06 | Payer: MEDICARE

## 2021-06-06 MED ADMIN — ONDANSETRON HCL (PF) 4 MG/2 ML IJ SOLN [136012]: 4 mg | INTRAVENOUS | @ 14:00:00 | NDC 72266012301

## 2021-06-06 MED ADMIN — OXYCODONE 5 MG PO TAB [10814]: 10 mg | ORAL | @ 02:00:00 | NDC 00406055223

## 2021-06-06 MED ADMIN — ACETAMINOPHEN 500 MG PO TAB [102]: 1000 mg | ORAL | @ 16:00:00 | Stop: 2021-06-09 | NDC 00904672080

## 2021-06-06 MED ADMIN — OXYCODONE 5 MG PO TAB [10814]: 10 mg | ORAL | @ 18:00:00 | NDC 47781026305

## 2021-06-06 MED ADMIN — CEFAZOLIN INJ 1GM IVP [210319]: 2 g | INTRAVENOUS | @ 02:00:00 | Stop: 2021-06-07 | NDC 60505614200

## 2021-06-06 MED ADMIN — FENTANYL CITRATE (PF) 50 MCG/ML IJ SOLN [3037]: 50 ug | INTRAVENOUS | @ 05:00:00 | Stop: 2021-06-06 | NDC 00409909412

## 2021-06-06 MED ADMIN — SODIUM CHLORIDE 0.9 % IV SOLP [27838]: 1000.000 mL | INTRAVENOUS | @ 09:00:00 | Stop: 2021-06-07 | NDC 00338004904

## 2021-06-06 MED ADMIN — FUROSEMIDE 10 MG/ML IJ SOLN [3291]: 40 mg | INTRAVENOUS | @ 16:00:00 | Stop: 2021-06-06 | NDC 70860030242

## 2021-06-06 MED ADMIN — METHOCARBAMOL 500 MG PO TAB [4971]: 750 mg | ORAL | @ 14:00:00 | Stop: 2021-06-06 | NDC 00904705761

## 2021-06-06 MED ADMIN — ACETAMINOPHEN 500 MG PO TAB [102]: 1000 mg | ORAL | @ 09:00:00 | Stop: 2021-06-09 | NDC 00904672080

## 2021-06-06 MED ADMIN — FENTANYL CITRATE (PF) 50 MCG/ML IJ SOLN [3037]: 50 ug | INTRAVENOUS | @ 04:00:00 | Stop: 2021-06-06 | NDC 00409909412

## 2021-06-06 MED ADMIN — ALLOPURINOL 100 MG PO TAB [310]: 200 mg | ORAL | @ 03:00:00 | NDC 00904704161

## 2021-06-06 MED ADMIN — OXYCODONE 10 MG PO TAB [166908]: 10 mg | ORAL | @ 23:00:00 | NDC 68084096811

## 2021-06-06 MED ADMIN — SENNOSIDES-DOCUSATE SODIUM 8.6-50 MG PO TAB [40926]: 2 | ORAL | @ 16:00:00 | NDC 00536124801

## 2021-06-06 MED ADMIN — ACETAMINOPHEN 500 MG PO TAB [102]: 1000 mg | ORAL | @ 03:00:00 | Stop: 2021-06-09 | NDC 00904672080

## 2021-06-06 MED ADMIN — METOCLOPRAMIDE HCL 5 MG/ML IJ SOLN [5002]: 10 mg | INTRAVENOUS | @ 16:00:00 | Stop: 2021-06-07 | NDC 00409341418

## 2021-06-06 MED ADMIN — ALBUMIN, HUMAN 5 % IV SOLP [8982]: 250 mL | INTRAVENOUS | @ 07:00:00 | Stop: 2021-06-06 | NDC 68516521401

## 2021-06-06 MED ADMIN — NOREPINEPHRINE BITARTRATE-NACL 4 MG/250 ML (16 MCG/ML) IV SOLN [173409]: 0.07 ug/kg/min | INTRAVENOUS | @ 09:00:00 | NDC 70092103305

## 2021-06-06 MED ADMIN — CEFAZOLIN INJ 1GM IVP [210319]: 2 g | INTRAVENOUS | @ 10:00:00 | Stop: 2021-06-06 | NDC 60505614200

## 2021-06-06 MED ADMIN — DIAZEPAM 5 MG PO TAB [2405]: 2.5 mg | ORAL | @ 19:00:00 | Stop: 2021-06-06 | NDC 51079028501

## 2021-06-06 MED ADMIN — LIDOCAINE 5 % TP PTMD [80759]: 2 | TOPICAL | @ 06:00:00 | NDC 00591352511

## 2021-06-06 MED ADMIN — ALLOPURINOL 300 MG PO TAB [311]: 300 mg | ORAL | @ 16:00:00 | NDC 55111073001

## 2021-06-06 MED ADMIN — HYDROMORPHONE (PF) 2 MG/ML IJ SYRG [163476]: 0.5 mg | INTRAVENOUS | @ 21:00:00 | Stop: 2021-06-06 | NDC 00409131203

## 2021-06-06 MED ADMIN — ASPIRIN 81 MG PO CHEW [680]: 81 mg | ORAL | @ 16:00:00 | NDC 16103036611

## 2021-06-06 MED ADMIN — PANTOPRAZOLE 40 MG IV SOLR [78621]: 40 mg | INTRAVENOUS | @ 02:00:00 | NDC 55150020200

## 2021-06-06 MED ADMIN — VASOPRESSIN 0.2 UNIT/ML IV SOLN [458566]: 1.8 [IU]/h | INTRAVENOUS | @ 19:00:00 | NDC 42023023701

## 2021-06-06 MED ADMIN — HYDROMORPHONE (PF) 2 MG/ML IJ SYRG [163476]: 1 mg | INTRAVENOUS | @ 06:00:00 | Stop: 2021-06-06 | NDC 00409131203

## 2021-06-06 MED ADMIN — INSULIN ASPART 100 UNIT/ML SC FLEXPEN [87504]: 2 [IU] | SUBCUTANEOUS | @ 19:00:00 | NDC 00169633910

## 2021-06-06 MED ADMIN — VASOPRESSIN 0.2 UNIT/ML IV SOLN [458566]: 2.4 [IU]/h | INTRAVENOUS | @ 11:00:00 | NDC 42023023701

## 2021-06-06 MED ADMIN — ACETAMINOPHEN 1,000 MG/100 ML (10 MG/ML) IV SOLN [305632]: 1000 mg | INTRAVENOUS | @ 21:00:00 | Stop: 2021-06-06 | NDC 36000030660

## 2021-06-06 MED ADMIN — ALBUMIN, HUMAN 5 % IV SOLP [8982]: 250 mL | INTRAVENOUS | @ 11:00:00 | Stop: 2021-06-06 | NDC 68516521401

## 2021-06-06 MED ADMIN — AMIODARONE 200 MG PO TAB [9066]: 400 mg | ORAL | @ 16:00:00 | NDC 51672402504

## 2021-06-06 MED ADMIN — LIDOCAINE 5 % TP PTMD [80759]: 2 | TOPICAL | @ 18:00:00 | NDC 00591352511

## 2021-06-06 MED ADMIN — OXYCODONE 5 MG PO TAB [10814]: 5 mg | ORAL | @ 04:00:00 | Stop: 2021-06-06 | NDC 00406055223

## 2021-06-06 MED ADMIN — ROSUVASTATIN 20 MG PO TAB [88504]: 20 mg | ORAL | @ 02:00:00 | NDC 68462026390

## 2021-06-06 MED ADMIN — OXYCODONE 5 MG PO TAB [10814]: 10 mg | ORAL | @ 07:00:00 | NDC 00406055223

## 2021-06-06 MED ADMIN — OXYCODONE 10 MG PO TAB [166908]: 10 mg | ORAL | @ 11:00:00 | NDC 68084096811

## 2021-06-06 MED ADMIN — MAGNESIUM HYDROXIDE 2,400 MG/10 ML PO SUSP [136089]: 10 mL | ORAL | @ 09:00:00 | NDC 00121094010

## 2021-06-06 MED ADMIN — CEFAZOLIN INJ 1GM IVP [210319]: 2 g | INTRAVENOUS | @ 17:00:00 | Stop: 2021-06-06 | NDC 60505614200

## 2021-06-06 MED ADMIN — METOCLOPRAMIDE HCL 5 MG/ML IJ SOLN [5002]: 10 mg | INTRAVENOUS | @ 23:00:00 | Stop: 2021-06-07 | NDC 00409341418

## 2021-06-07 ENCOUNTER — Inpatient Hospital Stay: Admit: 2021-06-07 | Discharge: 2021-06-07 | Payer: MEDICARE

## 2021-06-07 MED ADMIN — VASOPRESSIN 0.2 UNIT/ML IV SOLN [458566]: 2.4 [IU]/h | INTRAVENOUS | @ 09:00:00 | NDC 42023023701

## 2021-06-07 MED ADMIN — OXYCODONE 5 MG PO TAB [10814]: 10 mg | ORAL | @ 03:00:00 | NDC 68084035411

## 2021-06-07 MED ADMIN — ACETAMINOPHEN 500 MG PO TAB [102]: 1000 mg | ORAL | @ 03:00:00 | Stop: 2021-06-09 | NDC 00904672080

## 2021-06-07 MED ADMIN — OXYCODONE 5 MG PO TAB [10814]: 10 mg | ORAL | @ 15:00:00 | NDC 00406055223

## 2021-06-07 MED ADMIN — MIDODRINE 5 MG PO TAB [10610]: 10 mg | ORAL | @ 15:00:00 | NDC 00245021211

## 2021-06-07 MED ADMIN — POLYETHYLENE GLYCOL 3350 17 GRAM PO PWPK [25424]: 17 g | ORAL | @ 15:00:00 | NDC 00904693186

## 2021-06-07 MED ADMIN — MIDODRINE 5 MG PO TAB [10610]: 10 mg | ORAL | @ 17:00:00 | NDC 00245021211

## 2021-06-07 MED ADMIN — AMIODARONE 200 MG PO TAB [9066]: 400 mg | ORAL | @ 03:00:00 | NDC 51672402504

## 2021-06-07 MED ADMIN — METOCLOPRAMIDE HCL 5 MG/ML IJ SOLN [5002]: 10 mg | INTRAVENOUS | @ 03:00:00 | Stop: 2021-06-07 | NDC 00409341418

## 2021-06-07 MED ADMIN — ACETAMINOPHEN 500 MG PO TAB [102]: 1000 mg | ORAL | @ 21:00:00 | Stop: 2021-06-09 | NDC 00904672080

## 2021-06-07 MED ADMIN — SENNOSIDES-DOCUSATE SODIUM 8.6-50 MG PO TAB [40926]: 2 | ORAL | @ 15:00:00 | NDC 00536124801

## 2021-06-07 MED ADMIN — ASPIRIN 81 MG PO CHEW [680]: 81 mg | ORAL | @ 15:00:00 | NDC 16103036611

## 2021-06-07 MED ADMIN — ACETAMINOPHEN 500 MG PO TAB [102]: 1000 mg | ORAL | @ 15:00:00 | Stop: 2021-06-09 | NDC 00904672080

## 2021-06-07 MED ADMIN — AMIODARONE 200 MG PO TAB [9066]: 400 mg | ORAL | @ 15:00:00 | NDC 51672402504

## 2021-06-07 MED ADMIN — MAGNESIUM HYDROXIDE 2,400 MG/10 ML PO SUSP [136089]: 10 mL | ORAL | @ 20:00:00 | NDC 00121094010

## 2021-06-07 MED ADMIN — ROSUVASTATIN 20 MG PO TAB [88504]: 20 mg | ORAL | @ 03:00:00 | NDC 68462026390

## 2021-06-07 MED ADMIN — ALLOPURINOL 300 MG PO TAB [311]: 300 mg | ORAL | @ 15:00:00 | NDC 55111073001

## 2021-06-07 MED ADMIN — OXYCODONE 5 MG PO TAB [10814]: 10 mg | ORAL | @ 20:00:00 | NDC 00406055223

## 2021-06-07 MED ADMIN — MAGNESIUM HYDROXIDE 2,400 MG/10 ML PO SUSP [136089]: 10 mL | ORAL | @ 03:00:00 | NDC 00121094010

## 2021-06-07 MED ADMIN — LIDOCAINE 5 % TP PTMD [80759]: 2 | TOPICAL | @ 15:00:00 | NDC 00591352511

## 2021-06-07 MED ADMIN — SENNOSIDES-DOCUSATE SODIUM 8.6-50 MG PO TAB [40926]: 2 | ORAL | @ 03:00:00 | NDC 00536124801

## 2021-06-07 MED ADMIN — ALLOPURINOL 100 MG PO TAB [310]: 200 mg | ORAL | @ 03:00:00 | NDC 00904704161

## 2021-06-07 MED ADMIN — OXYCODONE 5 MG PO TAB [10814]: 10 mg | ORAL | @ 08:00:00 | NDC 47781026305

## 2021-06-07 MED ADMIN — VASOPRESSIN 0.2 UNIT/ML IV SOLN [458566]: 2.4 [IU]/h | INTRAVENOUS | @ 03:00:00 | NDC 42023023701

## 2021-06-07 MED ADMIN — FUROSEMIDE 10 MG/ML IJ SOLN [3291]: 40 mg | INTRAVENOUS | @ 15:00:00 | Stop: 2021-06-07 | NDC 70860030242

## 2021-06-08 ENCOUNTER — Inpatient Hospital Stay: Admit: 2021-06-08 | Discharge: 2021-06-08 | Payer: MEDICARE

## 2021-06-08 MED ADMIN — MIDODRINE 5 MG PO TAB [10610]: 10 mg | ORAL | @ 10:00:00 | Stop: 2021-06-09 | NDC 00245021211

## 2021-06-08 MED ADMIN — ACETAMINOPHEN 500 MG PO TAB [102]: 1000 mg | ORAL | @ 22:00:00 | Stop: 2021-06-09 | NDC 00904672080

## 2021-06-08 MED ADMIN — HEPARIN, PORCINE (PF) 5,000 UNIT/0.5 ML IJ SYRG [95535]: 5000 [IU] | SUBCUTANEOUS | @ 19:00:00 | NDC 00409131611

## 2021-06-08 MED ADMIN — ALLOPURINOL 100 MG PO TAB [310]: 200 mg | ORAL | @ 03:00:00 | NDC 00904704161

## 2021-06-08 MED ADMIN — BISACODYL 10 MG RE SUPP [1080]: 20 mg | RECTAL | @ 16:00:00 | NDC 81421002102

## 2021-06-08 MED ADMIN — SENNOSIDES-DOCUSATE SODIUM 8.6-50 MG PO TAB [40926]: 2 | ORAL | @ 03:00:00 | NDC 00536124801

## 2021-06-08 MED ADMIN — MAGNESIUM HYDROXIDE 2,400 MG/10 ML PO SUSP [136089]: 10 mL | ORAL | @ 14:00:00 | NDC 00121094010

## 2021-06-08 MED ADMIN — POLYETHYLENE GLYCOL 3350 17 GRAM PO PWPK [25424]: 17 g | ORAL | @ 03:00:00 | NDC 00904693186

## 2021-06-08 MED ADMIN — OXYCODONE 5 MG PO TAB [10814]: 5 mg | ORAL | @ 07:00:00 | NDC 00406055223

## 2021-06-08 MED ADMIN — AMIODARONE 200 MG PO TAB [9066]: 400 mg | ORAL | @ 03:00:00 | NDC 51672402504

## 2021-06-08 MED ADMIN — AMIODARONE 200 MG PO TAB [9066]: 400 mg | ORAL | @ 14:00:00 | NDC 51672402504

## 2021-06-08 MED ADMIN — ACETAMINOPHEN 500 MG PO TAB [102]: 1000 mg | ORAL | @ 03:00:00 | Stop: 2021-06-09 | NDC 00904672080

## 2021-06-08 MED ADMIN — LACTATED RINGERS IV SOLP [4318]: 250 mL | INTRAVENOUS | @ 03:00:00 | Stop: 2021-06-08 | NDC 00338011703

## 2021-06-08 MED ADMIN — FUROSEMIDE 10 MG/ML IJ SOLN [3291]: 80 mg | INTRAVENOUS | @ 19:00:00 | NDC 70860030242

## 2021-06-08 MED ADMIN — MIDODRINE 5 MG PO TAB [10610]: 10 mg | ORAL | @ 19:00:00 | Stop: 2021-06-09 | NDC 00245021211

## 2021-06-08 MED ADMIN — POTASSIUM CHLORIDE 20 MEQ PO TBTQ [35943]: 20 meq | ORAL | @ 10:00:00 | Stop: 2022-06-05 | NDC 00832532511

## 2021-06-08 MED ADMIN — SENNOSIDES-DOCUSATE SODIUM 8.6-50 MG PO TAB [40926]: 2 | ORAL | @ 14:00:00 | NDC 00536124801

## 2021-06-08 MED ADMIN — ROSUVASTATIN 20 MG PO TAB [88504]: 20 mg | ORAL | @ 03:00:00 | NDC 68462026390

## 2021-06-08 MED ADMIN — ALLOPURINOL 300 MG PO TAB [311]: 300 mg | ORAL | @ 14:00:00 | NDC 55111073001

## 2021-06-08 MED ADMIN — POTASSIUM CHLORIDE 20 MEQ PO TBTQ [35943]: 20 meq | ORAL | @ 01:00:00 | Stop: 2022-06-05 | NDC 00832532511

## 2021-06-08 MED ADMIN — LIDOCAINE 5 % TP PTMD [80759]: 2 | TOPICAL | @ 14:00:00 | NDC 00591352511

## 2021-06-08 MED ADMIN — ASPIRIN 81 MG PO CHEW [680]: 81 mg | ORAL | @ 14:00:00 | NDC 16103036611

## 2021-06-08 MED ADMIN — POLYETHYLENE GLYCOL 3350 17 GRAM PO PWPK [25424]: 17 g | ORAL | @ 14:00:00 | Stop: 2021-06-08 | NDC 00904693186

## 2021-06-08 MED ADMIN — VASOPRESSIN 0.2 UNIT/ML IV SOLN [458566]: 0.6 [IU]/h | INTRAVENOUS | @ 12:00:00 | Stop: 2021-06-08 | NDC 42023023701

## 2021-06-08 MED ADMIN — MIDODRINE 5 MG PO TAB [10610]: 10 mg | ORAL | @ 03:00:00 | NDC 00245021211

## 2021-06-08 MED ADMIN — ACETAMINOPHEN 500 MG PO TAB [102]: 1000 mg | ORAL | @ 14:00:00 | Stop: 2021-06-09 | NDC 00904672080

## 2021-06-08 MED ADMIN — FUROSEMIDE 10 MG/ML IJ SOLN [3291]: 80 mg | INTRAVENOUS | @ 16:00:00 | Stop: 2021-06-08 | NDC 70860030242

## 2021-06-08 MED ADMIN — OXYCODONE 5 MG PO TAB [10814]: 10 mg | ORAL | @ 02:00:00 | NDC 00406055223

## 2021-06-08 MED ADMIN — OXYCODONE 5 MG PO TAB [10814]: 5 mg | ORAL | @ 06:00:00 | NDC 00406055223

## 2021-06-08 MED ADMIN — OXYCODONE 5 MG PO TAB [10814]: 10 mg | ORAL | @ 16:00:00 | NDC 00406055223

## 2021-06-08 MED ADMIN — DIAZEPAM 5 MG PO TAB [2405]: 5 mg | ORAL | @ 03:00:00 | Stop: 2021-06-08 | NDC 51079028501

## 2021-06-09 ENCOUNTER — Encounter: Admit: 2021-06-09 | Discharge: 2021-06-09 | Payer: MEDICARE

## 2021-06-09 ENCOUNTER — Inpatient Hospital Stay: Admit: 2021-06-09 | Discharge: 2021-06-09 | Payer: MEDICARE

## 2021-06-09 DIAGNOSIS — G4733 Obstructive sleep apnea (adult) (pediatric): Secondary | ICD-10-CM

## 2021-06-09 DIAGNOSIS — I639 Cerebral infarction, unspecified: Secondary | ICD-10-CM

## 2021-06-09 DIAGNOSIS — E7800 Pure hypercholesterolemia, unspecified: Secondary | ICD-10-CM

## 2021-06-09 DIAGNOSIS — I10 Essential (primary) hypertension: Secondary | ICD-10-CM

## 2021-06-09 DIAGNOSIS — I729 Aneurysm of unspecified site: Secondary | ICD-10-CM

## 2021-06-09 DIAGNOSIS — I219 Acute myocardial infarction, unspecified: Secondary | ICD-10-CM

## 2021-06-09 DIAGNOSIS — K5792 Diverticulitis of intestine, part unspecified, without perforation or abscess without bleeding: Secondary | ICD-10-CM

## 2021-06-09 DIAGNOSIS — J302 Other seasonal allergic rhinitis: Secondary | ICD-10-CM

## 2021-06-09 DIAGNOSIS — I7789 Other specified disorders of arteries and arterioles: Secondary | ICD-10-CM

## 2021-06-09 DIAGNOSIS — K429 Umbilical hernia without obstruction or gangrene: Secondary | ICD-10-CM

## 2021-06-09 DIAGNOSIS — M109 Gout, unspecified: Secondary | ICD-10-CM

## 2021-06-09 DIAGNOSIS — Z9229 Personal history of other drug therapy: Secondary | ICD-10-CM

## 2021-06-09 DIAGNOSIS — I671 Cerebral aneurysm, nonruptured: Secondary | ICD-10-CM

## 2021-06-09 DIAGNOSIS — I2510 Atherosclerotic heart disease of native coronary artery without angina pectoris: Secondary | ICD-10-CM

## 2021-06-09 DIAGNOSIS — G629 Polyneuropathy, unspecified: Secondary | ICD-10-CM

## 2021-06-09 DIAGNOSIS — I779 Disorder of arteries and arterioles, unspecified: Secondary | ICD-10-CM

## 2021-06-09 DIAGNOSIS — M1990 Unspecified osteoarthritis, unspecified site: Secondary | ICD-10-CM

## 2021-06-09 MED ADMIN — ACETAZOLAMIDE SODIUM 500 MG IJ SOLR [114]: 500 mg | INTRAVENOUS | @ 12:00:00 | Stop: 2021-06-09 | NDC 23155031331

## 2021-06-09 MED ADMIN — OXYCODONE 5 MG PO TAB [10814]: 10 mg | ORAL | @ 02:00:00 | NDC 00406055223

## 2021-06-09 MED ADMIN — POTASSIUM CHLORIDE 20 MEQ PO TBTQ [35943]: 20 meq | ORAL | @ 12:00:00 | Stop: 2022-06-05 | NDC 00832532511

## 2021-06-09 MED ADMIN — ALLOPURINOL 100 MG PO TAB [310]: 200 mg | ORAL | @ 03:00:00 | NDC 00904704161

## 2021-06-09 MED ADMIN — LIDOCAINE 5 % TP PTMD [80759]: 2 | TOPICAL | @ 16:00:00 | NDC 00591352511

## 2021-06-09 MED ADMIN — MIDODRINE 5 MG PO TAB [10610]: 5 mg | ORAL | @ 02:00:00 | Stop: 2021-06-09 | NDC 00245021211

## 2021-06-09 MED ADMIN — HEPARIN, PORCINE (PF) 5,000 UNIT/0.5 ML IJ SYRG [95535]: 5000 [IU] | SUBCUTANEOUS | @ 20:00:00 | NDC 00409131611

## 2021-06-09 MED ADMIN — ALLOPURINOL 300 MG PO TAB [311]: 300 mg | ORAL | @ 16:00:00 | NDC 55111073001

## 2021-06-09 MED ADMIN — MIDODRINE 5 MG PO TAB [10610]: 5 mg | ORAL | @ 10:00:00 | Stop: 2021-06-09 | NDC 00245021211

## 2021-06-09 MED ADMIN — ALLOPURINOL 100 MG PO TAB [310]: 300 mg | ORAL | @ 16:00:00 | NDC 00904704161

## 2021-06-09 MED ADMIN — ACETAZOLAMIDE SODIUM 500 MG IJ SOLR [114]: 500 mg | INTRAVENOUS | @ 05:00:00 | Stop: 2021-06-09 | NDC 23155031331

## 2021-06-09 MED ADMIN — ASPIRIN 81 MG PO CHEW [680]: 81 mg | ORAL | @ 16:00:00 | NDC 16103036611

## 2021-06-09 MED ADMIN — HEPARIN, PORCINE (PF) 5,000 UNIT/0.5 ML IJ SYRG [95535]: 5000 [IU] | SUBCUTANEOUS | @ 12:00:00 | NDC 00409131611

## 2021-06-09 MED ADMIN — FUROSEMIDE 10 MG/ML IJ SOLN [3291]: 80 mg | INTRAVENOUS | @ 03:00:00 | NDC 70860030242

## 2021-06-09 MED ADMIN — FUROSEMIDE 10 MG/ML IJ SOLN [3291]: 80 mg | INTRAVENOUS | @ 12:00:00 | Stop: 2021-06-09 | NDC 70860030242

## 2021-06-09 MED ADMIN — ROSUVASTATIN 20 MG PO TAB [88504]: 20 mg | ORAL | @ 02:00:00 | NDC 68462026390

## 2021-06-09 MED ADMIN — ACETAMINOPHEN 500 MG PO TAB [102]: 1000 mg | ORAL | @ 02:00:00 | Stop: 2021-06-09 | NDC 00904672080

## 2021-06-09 MED ADMIN — HEPARIN, PORCINE (PF) 5,000 UNIT/0.5 ML IJ SYRG [95535]: 5000 [IU] | SUBCUTANEOUS | @ 03:00:00 | NDC 00409131611

## 2021-06-09 MED ADMIN — AMIODARONE 200 MG PO TAB [9066]: 400 mg | ORAL | @ 02:00:00 | NDC 51672402504

## 2021-06-09 MED ADMIN — MIDODRINE 5 MG PO TAB [10610]: 5 mg | ORAL | @ 18:00:00 | Stop: 2021-06-09 | NDC 00245021211

## 2021-06-09 MED ADMIN — POTASSIUM CHLORIDE 20 MEQ PO TBTQ [35943]: 40 meq | ORAL | @ 23:00:00 | Stop: 2022-06-05 | NDC 00832532511

## 2021-06-09 MED ADMIN — ONDANSETRON HCL (PF) 4 MG/2 ML IJ SOLN [136012]: 4 mg | INTRAVENOUS | @ 02:00:00 | NDC 72266012301

## 2021-06-09 MED ADMIN — ACETAMINOPHEN 500 MG PO TAB [102]: 1000 mg | ORAL | @ 16:00:00 | Stop: 2021-06-09 | NDC 00904672080

## 2021-06-09 MED ADMIN — AMIODARONE 200 MG PO TAB [9066]: 400 mg | ORAL | @ 16:00:00 | NDC 51672402504

## 2021-06-10 ENCOUNTER — Inpatient Hospital Stay: Admit: 2021-06-10 | Discharge: 2021-06-10 | Payer: MEDICARE

## 2021-06-10 MED ADMIN — OXYCODONE 10 MG PO TAB [166908]: 10 mg | ORAL | @ 18:00:00 | NDC 68084096811

## 2021-06-10 MED ADMIN — POTASSIUM CHLORIDE 20 MEQ PO TBTQ [35943]: 20 meq | ORAL | @ 22:00:00 | Stop: 2022-06-05 | NDC 00832532511

## 2021-06-10 MED ADMIN — POTASSIUM CHLORIDE 20 MEQ PO TBTQ [35943]: 20 meq | ORAL | @ 10:00:00 | Stop: 2022-06-05 | NDC 00832532511

## 2021-06-10 MED ADMIN — OXYCODONE 5 MG PO TAB [10814]: 5 mg | ORAL | @ 07:00:00 | NDC 00406055223

## 2021-06-10 MED ADMIN — ACETAMINOPHEN 325 MG PO TAB [101]: 650 mg | ORAL | @ 22:00:00 | NDC 00904677361

## 2021-06-10 MED ADMIN — ACETAMINOPHEN 325 MG PO TAB [101]: 650 mg | ORAL | @ 14:00:00 | NDC 00904677361

## 2021-06-10 MED ADMIN — AMIODARONE 200 MG PO TAB [9066]: 400 mg | ORAL | @ 03:00:00 | NDC 51672402504

## 2021-06-10 MED ADMIN — AMIODARONE 200 MG PO TAB [9066]: 400 mg | ORAL | @ 14:00:00 | NDC 51672402504

## 2021-06-10 MED ADMIN — OXYCODONE 5 MG PO TAB [10814]: 10 mg | ORAL | @ 22:00:00 | NDC 00904696661

## 2021-06-10 MED ADMIN — POTASSIUM CHLORIDE 20 MEQ PO TBTQ [35943]: 40 meq | ORAL | @ 04:00:00 | Stop: 2022-06-05 | NDC 00832532511

## 2021-06-10 MED ADMIN — TICAGRELOR 90 MG PO TAB [307896]: 90 mg | ORAL | @ 03:00:00 | NDC 00186077739

## 2021-06-10 MED ADMIN — ASPIRIN 81 MG PO CHEW [680]: 81 mg | ORAL | @ 14:00:00 | NDC 16103036611

## 2021-06-10 MED ADMIN — OXYCODONE 5 MG PO TAB [10814]: 5 mg | ORAL | @ 03:00:00 | NDC 00406055223

## 2021-06-10 MED ADMIN — MAGNESIUM OXIDE 400 MG (241.3 MG MAGNESIUM) PO TAB [10491]: 200 mg | ORAL | @ 22:00:00 | Stop: 2021-06-20 | NDC 64980033990

## 2021-06-10 MED ADMIN — ROSUVASTATIN 20 MG PO TAB [88504]: 20 mg | ORAL | @ 03:00:00 | NDC 68462026390

## 2021-06-10 MED ADMIN — FUROSEMIDE 10 MG/ML IJ SOLN [3291]: 80 mg | INTRAVENOUS | @ 14:00:00 | Stop: 2021-06-10 | NDC 70860030242

## 2021-06-10 MED ADMIN — FUROSEMIDE 10 MG/ML IJ SOLN [3291]: 80 mg | INTRAVENOUS | @ 19:00:00 | Stop: 2021-06-10 | NDC 70860030242

## 2021-06-10 MED ADMIN — ALLOPURINOL 100 MG PO TAB [310]: 200 mg | ORAL | @ 03:00:00 | NDC 00904704161

## 2021-06-10 MED ADMIN — ALLOPURINOL 300 MG PO TAB [311]: 300 mg | ORAL | @ 14:00:00 | NDC 55111073001

## 2021-06-10 MED ADMIN — SENNOSIDES-DOCUSATE SODIUM 8.6-50 MG PO TAB [40926]: 2 | ORAL | @ 03:00:00 | NDC 00536124801

## 2021-06-10 MED ADMIN — LIDOCAINE 5 % TP PTMD [80759]: 2 | TOPICAL | @ 14:00:00 | NDC 00591352511

## 2021-06-10 MED ADMIN — HEPARIN, PORCINE (PF) 5,000 UNIT/0.5 ML IJ SYRG [95535]: 5000 [IU] | SUBCUTANEOUS | @ 04:00:00 | NDC 00409131611

## 2021-06-10 MED ADMIN — TICAGRELOR 90 MG PO TAB [307896]: 90 mg | ORAL | @ 16:00:00 | NDC 00186077739

## 2021-06-10 MED ADMIN — BISACODYL 10 MG RE SUPP [1080]: 20 mg | RECTAL | @ 16:00:00 | NDC 00574705012

## 2021-06-10 MED ADMIN — HEPARIN, PORCINE (PF) 5,000 UNIT/0.5 ML IJ SYRG [95535]: 5000 [IU] | SUBCUTANEOUS | @ 13:00:00 | NDC 00409131611

## 2021-06-10 MED ADMIN — SENNOSIDES-DOCUSATE SODIUM 8.6-50 MG PO TAB [40926]: 2 | ORAL | @ 16:00:00 | NDC 00536124801

## 2021-06-10 MED ADMIN — HEPARIN, PORCINE (PF) 5,000 UNIT/0.5 ML IJ SYRG [95535]: 5000 [IU] | SUBCUTANEOUS | @ 19:00:00 | NDC 00409131611

## 2021-06-11 ENCOUNTER — Inpatient Hospital Stay: Admit: 2021-06-11 | Discharge: 2021-06-11 | Payer: MEDICARE

## 2021-06-11 MED ADMIN — ASPIRIN 81 MG PO CHEW [680]: 81 mg | ORAL | @ 15:00:00 | NDC 16103036611

## 2021-06-11 MED ADMIN — HEPARIN, PORCINE (PF) 5,000 UNIT/0.5 ML IJ SYRG [95535]: 5000 [IU] | SUBCUTANEOUS | @ 03:00:00 | NDC 00409131611

## 2021-06-11 MED ADMIN — FUROSEMIDE 10 MG/ML IJ SOLN [3291]: 80 mg | INTRAVENOUS | @ 15:00:00 | Stop: 2021-06-11 | NDC 70860030242

## 2021-06-11 MED ADMIN — METOPROLOL TARTRATE 25 MG PO TAB [37637]: 12.5 mg | ORAL | @ 16:00:00 | NDC 62332011231

## 2021-06-11 MED ADMIN — HEPARIN, PORCINE (PF) 5,000 UNIT/0.5 ML IJ SYRG [95535]: 5000 [IU] | SUBCUTANEOUS | @ 12:00:00 | NDC 00409131611

## 2021-06-11 MED ADMIN — BISACODYL 10 MG RE SUPP [1080]: 20 mg | RECTAL | @ 15:00:00 | NDC 00574705012

## 2021-06-11 MED ADMIN — POTASSIUM CHLORIDE 20 MEQ PO TBTQ [35943]: 20 meq | ORAL | @ 11:00:00 | Stop: 2022-06-05 | NDC 00832532511

## 2021-06-11 MED ADMIN — ONDANSETRON HCL (PF) 4 MG/2 ML IJ SOLN [136012]: 4 mg | INTRAVENOUS | @ 20:00:00 | NDC 72266012301

## 2021-06-11 MED ADMIN — NALOXEGOL 12.5 MG PO TAB [324272]: 25 mg | ORAL | @ 21:00:00 | Stop: 2021-06-11 | NDC 57841130001

## 2021-06-11 MED ADMIN — HEPARIN, PORCINE (PF) 5,000 UNIT/0.5 ML IJ SYRG [95535]: 5000 [IU] | SUBCUTANEOUS | @ 20:00:00 | NDC 00409131611

## 2021-06-11 MED ADMIN — AMIODARONE 200 MG PO TAB [9066]: 400 mg | ORAL | @ 17:00:00 | NDC 00904699361

## 2021-06-11 MED ADMIN — ALLOPURINOL 300 MG PO TAB [311]: 300 mg | ORAL | @ 15:00:00 | NDC 55111073001

## 2021-06-11 MED ADMIN — ALLOPURINOL 100 MG PO TAB [310]: 200 mg | ORAL | @ 03:00:00 | NDC 00904704161

## 2021-06-11 MED ADMIN — ALUM-MAG HYDROXIDE-SIMETH 200-200-20 MG/5 ML PO SUSP [38285]: 30 mL | ORAL | @ 11:00:00 | NDC 00121176130

## 2021-06-11 MED ADMIN — ACETAMINOPHEN 325 MG PO TAB [101]: 650 mg | ORAL | @ 11:00:00 | NDC 00904677361

## 2021-06-11 MED ADMIN — SENNOSIDES-DOCUSATE SODIUM 8.6-50 MG PO TAB [40926]: 2 | ORAL | @ 15:00:00 | NDC 00536124801

## 2021-06-11 MED ADMIN — LIDOCAINE 5 % TP PTMD [80759]: 2 | TOPICAL | @ 15:00:00 | NDC 00591267911

## 2021-06-11 MED ADMIN — POTASSIUM CHLORIDE 20 MEQ PO TBTQ [35943]: 40 meq | ORAL | @ 03:00:00 | Stop: 2022-06-05 | NDC 00832532511

## 2021-06-11 MED ADMIN — TICAGRELOR 90 MG PO TAB [307896]: 90 mg | ORAL | @ 15:00:00 | NDC 00186077739

## 2021-06-11 MED ADMIN — TICAGRELOR 90 MG PO TAB [307896]: 90 mg | ORAL | @ 03:00:00 | NDC 00186077739

## 2021-06-11 MED ADMIN — ROSUVASTATIN 20 MG PO TAB [88504]: 20 mg | ORAL | @ 03:00:00 | NDC 68462026390

## 2021-06-11 MED ADMIN — MAGNESIUM HYDROXIDE 2,400 MG/10 ML PO SUSP [136089]: 10 mL | ORAL | @ 03:00:00 | NDC 00121094010

## 2021-06-11 MED ADMIN — AMIODARONE 200 MG PO TAB [9066]: 400 mg | ORAL | @ 03:00:00 | NDC 51672402504

## 2021-06-11 MED ADMIN — SENNOSIDES-DOCUSATE SODIUM 8.6-50 MG PO TAB [40926]: 2 | ORAL | @ 03:00:00 | NDC 00536124801

## 2021-06-11 MED ADMIN — MAGNESIUM OXIDE 400 MG (241.3 MG MAGNESIUM) PO TAB [10491]: 200 mg | ORAL | @ 11:00:00 | Stop: 2021-06-20 | NDC 64980033912

## 2021-06-12 ENCOUNTER — Encounter: Admit: 2021-06-12 | Discharge: 2021-06-12 | Payer: MEDICARE

## 2021-06-12 ENCOUNTER — Inpatient Hospital Stay: Admit: 2021-06-12 | Discharge: 2021-06-12 | Payer: MEDICARE

## 2021-06-12 MED ADMIN — AMIODARONE 200 MG PO TAB [9066]: 400 mg | ORAL | @ 15:00:00 | Stop: 2021-06-12 | NDC 00904699361

## 2021-06-12 MED ADMIN — METOPROLOL TARTRATE 25 MG PO TAB [37637]: 12.5 mg | ORAL | @ 15:00:00 | Stop: 2021-06-12 | NDC 62584026511

## 2021-06-12 MED ADMIN — METOPROLOL TARTRATE 25 MG PO TAB [37637]: 12.5 mg | ORAL | @ 03:00:00 | NDC 62332011231

## 2021-06-12 MED ADMIN — TICAGRELOR 90 MG PO TAB [307896]: 90 mg | ORAL | @ 15:00:00 | Stop: 2021-06-12 | NDC 00186077739

## 2021-06-12 MED ADMIN — ACETAMINOPHEN 325 MG PO TAB [101]: 650 mg | ORAL | @ 06:00:00 | Stop: 2021-06-12 | NDC 00904677361

## 2021-06-12 MED ADMIN — ROSUVASTATIN 20 MG PO TAB [88504]: 20 mg | ORAL | @ 03:00:00 | NDC 68462026390

## 2021-06-12 MED ADMIN — MAGNESIUM OXIDE 400 MG (241.3 MG MAGNESIUM) PO TAB [10491]: 200 mg | ORAL | @ 11:00:00 | Stop: 2021-06-12 | NDC 64980033912

## 2021-06-12 MED ADMIN — ASPIRIN 81 MG PO CHEW [680]: 81 mg | ORAL | @ 15:00:00 | Stop: 2021-06-12 | NDC 66553000201

## 2021-06-12 MED ADMIN — POTASSIUM CHLORIDE 20 MEQ PO TBTQ [35943]: 20 meq | ORAL | @ 11:00:00 | Stop: 2021-06-12 | NDC 00832532511

## 2021-06-12 MED ADMIN — AMIODARONE 200 MG PO TAB [9066]: 400 mg | ORAL | @ 03:00:00 | NDC 51672402504

## 2021-06-12 MED ADMIN — TICAGRELOR 90 MG PO TAB [307896]: 90 mg | ORAL | @ 03:00:00 | NDC 00186077739

## 2021-06-12 MED ADMIN — ACETAMINOPHEN 325 MG PO TAB [101]: 650 mg | ORAL | @ 21:00:00 | Stop: 2021-06-12 | NDC 00904677361

## 2021-06-12 MED ADMIN — ACETAMINOPHEN 325 MG PO TAB [101]: 650 mg | ORAL | @ 12:00:00 | Stop: 2021-06-12 | NDC 00904677361

## 2021-06-12 MED ADMIN — ALLOPURINOL 300 MG PO TAB [311]: 300 mg | ORAL | @ 15:00:00 | Stop: 2021-06-12 | NDC 00904657261

## 2021-06-12 MED ADMIN — ALLOPURINOL 100 MG PO TAB [310]: 200 mg | ORAL | @ 03:00:00 | NDC 00904704161

## 2021-06-12 MED ADMIN — FUROSEMIDE 40 MG PO TAB [3295]: 40 mg | ORAL | @ 15:00:00 | Stop: 2021-06-12 | NDC 00904717861

## 2021-06-12 MED ADMIN — HEPARIN, PORCINE (PF) 5,000 UNIT/0.5 ML IJ SYRG [95535]: 5000 [IU] | SUBCUTANEOUS | @ 12:00:00 | Stop: 2021-06-12 | NDC 00409131632

## 2021-06-12 MED ADMIN — HEPARIN, PORCINE (PF) 5,000 UNIT/0.5 ML IJ SYRG [95535]: 5000 [IU] | SUBCUTANEOUS | @ 03:00:00 | NDC 00409131611

## 2021-06-12 MED FILL — POTASSIUM CHLORIDE 20 MEQ PO TBTQ: 20 mEq | ORAL | 14 days supply | Qty: 14 | Fill #1 | Status: CP

## 2021-06-12 MED FILL — FUROSEMIDE 20 MG PO TAB: 20 mg | ORAL | 14 days supply | Qty: 28 | Fill #1 | Status: CP

## 2021-06-18 ENCOUNTER — Encounter: Admit: 2021-06-18 | Discharge: 2021-06-18 | Payer: MEDICARE

## 2021-06-18 NOTE — Progress Notes
Ronnie Williams [phone] dental team asking for clearance to proceed with urgent root canal s/p CABG.  Faxed letter to proceed from Dr. Melinda Crutch standpoint to [facility] [phone].     Spouse, [name], notified CTS team cleared patient to proceed with urgent dental work.

## 2021-06-18 NOTE — Telephone Encounter
Received a call from Tracey with [facility] who states that she received clearance from Dr. Melinda Crutch office to proceed with a root canal procedure, but she is wanting to know is patient needs additional antibiotics prior to procedure. Patient is currently on Amoxicillin 500mg  TID. Discussed with RAD in clinic. He states that his antibiotic regimen should be sufficient for the procedure.     Called and spoke with to inform her of RAD's recommendations. She was agreeable and had no further questions at this time.

## 2021-06-19 ENCOUNTER — Inpatient Hospital Stay: Admit: 2021-06-19 | Discharge: 2021-06-19 | Payer: MEDICARE

## 2021-06-19 ENCOUNTER — Encounter: Admit: 2021-06-19 | Discharge: 2021-06-19 | Payer: MEDICARE

## 2021-06-19 DIAGNOSIS — R079 Chest pain, unspecified: Secondary | ICD-10-CM

## 2021-06-19 MED ORDER — ASPIRIN 81 MG PO TBEC
81 mg | Freq: Every day | ORAL | 0 refills | Status: AC
Start: 2021-06-19 — End: ?
  Administered 2021-06-20 – 2021-06-21 (×2): 81 mg via ORAL

## 2021-06-19 MED ORDER — ALLOPURINOL 100 MG PO TAB
200 mg | Freq: Every evening | ORAL | 0 refills | Status: AC
Start: 2021-06-19 — End: ?
  Administered 2021-06-21: 04:00:00 200 mg via ORAL

## 2021-06-19 MED ORDER — TICAGRELOR 90 MG PO TAB
90 mg | Freq: Two times a day (BID) | ORAL | 0 refills | Status: AC
Start: 2021-06-19 — End: ?
  Administered 2021-06-20 – 2021-06-21 (×4): 90 mg via ORAL

## 2021-06-19 MED ORDER — HEPARIN (PORCINE) BOLUS FOR CONTINUOUS INFUSION (VIAL) - APTT MAIN
20-40 [IU]/kg | INTRAVENOUS | 0 refills | Status: AC
Start: 2021-06-19 — End: ?

## 2021-06-19 MED ORDER — HEPARIN (PORCINE) IN 5 % DEX 20,000 UNIT/500 ML (40 UNIT/ML) IV SOLP
0-2000 [IU]/h | INTRAVENOUS | 0 refills | Status: AC
Start: 2021-06-19 — End: ?
  Administered 2021-06-20: 06:00:00 1000 [IU]/h via INTRAVENOUS

## 2021-06-19 MED ORDER — FUROSEMIDE 40 MG PO TAB
40 mg | Freq: Every morning | ORAL | 0 refills | Status: AC
Start: 2021-06-19 — End: ?
  Administered 2021-06-20 – 2021-06-21 (×2): 40 mg via ORAL

## 2021-06-19 MED ORDER — HEPARIN (PORCINE) 1,000 UNIT/ML IJ SOLN
4000 [IU] | Freq: Once | INTRAVENOUS | 0 refills | Status: AC
Start: 2021-06-19 — End: ?

## 2021-06-19 MED ORDER — ROSUVASTATIN 20 MG PO TAB
20 mg | Freq: Every evening | ORAL | 0 refills | Status: AC
Start: 2021-06-19 — End: ?
  Administered 2021-06-21: 04:00:00 20 mg via ORAL

## 2021-06-19 MED ORDER — METOPROLOL TARTRATE 25 MG PO TAB
12.5 mg | Freq: Two times a day (BID) | ORAL | 0 refills | Status: AC
Start: 2021-06-19 — End: ?
  Administered 2021-06-20 – 2021-06-21 (×4): 12.5 mg via ORAL

## 2021-06-20 ENCOUNTER — Inpatient Hospital Stay: Admit: 2021-06-20 | Payer: MEDICARE

## 2021-06-20 ENCOUNTER — Encounter: Admit: 2021-06-20 | Discharge: 2021-06-20 | Payer: MEDICARE

## 2021-06-20 ENCOUNTER — Inpatient Hospital Stay: Admit: 2021-06-20 | Discharge: 2021-06-20 | Payer: MEDICARE

## 2021-06-20 MED ADMIN — FLUTICASONE PROPIONATE 50 MCG/ACTUATION NA SPSN [70536]: 2 | NASAL | @ 16:00:00 | NDC 60505082901

## 2021-06-20 MED ADMIN — ASPIRIN 81 MG PO CHEW [680]: 243 mg | ORAL | @ 16:00:00 | Stop: 2021-06-20 | NDC 66553000201

## 2021-06-20 MED ADMIN — LORATADINE 10 MG PO TAB [10466]: 10 mg | ORAL | @ 16:00:00 | NDC 00904685261

## 2021-06-20 MED ADMIN — PERFLUTREN LIPID MICROSPHERES 1.1 MG/ML IV SUSP [79178]: 1.5 mL | INTRAVENOUS | @ 13:00:00 | Stop: 2021-06-20 | NDC 11994001116

## 2021-06-20 MED ADMIN — AMOXICILLIN-POT CLAVULANATE 875-125 MG PO TAB [33228]: 875 mg | ORAL | @ 14:00:00 | NDC 00093227534

## 2021-06-20 MED ADMIN — AMOXICILLIN-POT CLAVULANATE 875-125 MG PO TAB [33228]: 875 mg | ORAL | @ 23:00:00 | NDC 00093227534

## 2021-06-20 MED ADMIN — ALLOPURINOL 300 MG PO TAB [311]: 300 mg | ORAL | @ 16:00:00 | NDC 00904657261

## 2021-06-21 ENCOUNTER — Encounter: Admit: 2021-06-21 | Discharge: 2021-06-21 | Payer: MEDICARE

## 2021-06-21 MED ADMIN — LORATADINE 10 MG PO TAB [10466]: 10 mg | ORAL | @ 15:00:00 | Stop: 2021-06-21 | NDC 00904685261

## 2021-06-21 MED ADMIN — AMOXICILLIN-POT CLAVULANATE 875-125 MG PO TAB [33228]: 875 mg | ORAL | @ 15:00:00 | Stop: 2021-06-21 | NDC 00093227534

## 2021-06-21 MED ADMIN — MAGNESIUM SULFATE IN WATER 4 GRAM/50 ML (8 %) IV PGBK [166563]: 4 g | INTRAVENOUS | @ 13:00:00 | Stop: 2021-06-21 | NDC 00338171940

## 2021-06-21 MED ADMIN — POTASSIUM CHLORIDE 20 MEQ PO TBTQ [35943]: 60 meq | ORAL | @ 13:00:00 | Stop: 2021-06-21 | NDC 00832532511

## 2021-06-21 MED ADMIN — ALLOPURINOL 300 MG PO TAB [311]: 300 mg | ORAL | @ 15:00:00 | Stop: 2021-06-21 | NDC 00904657261

## 2021-07-02 ENCOUNTER — Ambulatory Visit: Admit: 2021-07-02 | Discharge: 2021-07-02 | Payer: MEDICARE

## 2021-07-02 ENCOUNTER — Encounter: Admit: 2021-07-02 | Discharge: 2021-07-02 | Payer: MEDICARE

## 2021-07-02 DIAGNOSIS — K5792 Diverticulitis of intestine, part unspecified, without perforation or abscess without bleeding: Secondary | ICD-10-CM

## 2021-07-02 DIAGNOSIS — I10 Essential (primary) hypertension: Secondary | ICD-10-CM

## 2021-07-02 DIAGNOSIS — E7800 Pure hypercholesterolemia, unspecified: Secondary | ICD-10-CM

## 2021-07-02 DIAGNOSIS — M1990 Unspecified osteoarthritis, unspecified site: Secondary | ICD-10-CM

## 2021-07-02 DIAGNOSIS — I779 Disorder of arteries and arterioles, unspecified: Secondary | ICD-10-CM

## 2021-07-02 DIAGNOSIS — Z951 Presence of aortocoronary bypass graft: Secondary | ICD-10-CM

## 2021-07-02 DIAGNOSIS — G629 Polyneuropathy, unspecified: Secondary | ICD-10-CM

## 2021-07-02 DIAGNOSIS — I729 Aneurysm of unspecified site: Secondary | ICD-10-CM

## 2021-07-02 DIAGNOSIS — I639 Cerebral infarction, unspecified: Secondary | ICD-10-CM

## 2021-07-02 DIAGNOSIS — G4733 Obstructive sleep apnea (adult) (pediatric): Secondary | ICD-10-CM

## 2021-07-02 DIAGNOSIS — M109 Gout, unspecified: Secondary | ICD-10-CM

## 2021-07-02 DIAGNOSIS — I7789 Other specified disorders of arteries and arterioles: Secondary | ICD-10-CM

## 2021-07-02 DIAGNOSIS — K429 Umbilical hernia without obstruction or gangrene: Secondary | ICD-10-CM

## 2021-07-02 DIAGNOSIS — I2510 Atherosclerotic heart disease of native coronary artery without angina pectoris: Secondary | ICD-10-CM

## 2021-07-02 DIAGNOSIS — I219 Acute myocardial infarction, unspecified: Secondary | ICD-10-CM

## 2021-07-02 DIAGNOSIS — J302 Other seasonal allergic rhinitis: Secondary | ICD-10-CM

## 2021-07-02 DIAGNOSIS — I671 Cerebral aneurysm, nonruptured: Secondary | ICD-10-CM

## 2021-07-02 DIAGNOSIS — Z9229 Personal history of other drug therapy: Secondary | ICD-10-CM

## 2021-07-02 MED FILL — METOPROLOL TARTRATE 25 MG PO TAB: 25 mg | ORAL | 30 days supply | Qty: 30 | Fill #3 | Status: CP

## 2021-07-02 NOTE — Progress Notes
Date of Service: 07/02/2021       Subjective:             Ronnie Williams is a 66 y.o. male.      History of Present Illness  Today we had the pleasure of seeing your patient, Ronnie Williams, in our office for routine postop follow up after Coronary artery bypass grafting x 2 , internal mammary artery harvest, endoscopic left saphenous vein harvest, implantable loop recorder removal performed by Dr. Elvis Coil on 06/05/2021 for MV CAD. The patient underwent the procedure and tolerated it well.  He was monitored in the intensive care unit following surgery.  The patient was initially hemodynamically supported on pressors and inotropes for vasogenic and cardiogenic shock.  Vasoactive medications were weaned down and off as tolerated. He was started on Midodrine due to soft blood pressures while weaning vasoactive medications. Midodrine was able to be weaned off and he was started on a beta blocker and tolerated it well. He was diuresed as tolerated for fluid overload. His pta Brilinta was restarted on POD #4 due to recent PCI.  The patient was transitioned to telemetry status on post operative day #5.   The patient increased their activity and oral intake daily.  The patient had normal bowel and bladder function.  The patient was stable to be discharged Home on post operative day#7. He was discharged on Lasix 40mg  daily with supplemental potassium for two weeks.     Unfortunately, he was readmitted on February 2 for angina.  He underwent cardiac catheterization which revealed no significant occlusion of his recent drug-eluting stent to the circumflex artery.  His pain resolved and he was discharged home on Brilinta and aspirin. He required a couple additional days of lasix at home for increased volume and this has resolved.     Since discharge Ronnie Williams states he has been doing well. He has been walking 20 minutes daily. His home BPs have averaged 120's/50-70's and his heart rates averaged in the 60-80's. We are pleased with these numbers. His weight decreased after surgery due to deep bruising in his gums from the breathing tube but this has improved. He also has significant left neck and arm pain likely related to surgical positioning in the OR. This continues to improve with ice and heat and massage. He denies any fevers, drainage from incisions, popping/clicking of the sternum or worsening shortness of breath.     He has seen his PCP and is scheduled with Dr. Arna Medici on 07/10/2021.     He is participating in cardiac rehab starting last week and he is doing well with this.    Chest xray performed today was unremarkable and his sternal wires were aligned and intact. I am pleased with this result.     Patient was informed that at 6 weeks from the date of surgery he may gradually increase the amount of weight that he is lifting. He should start at low weights and gradually work his way up. If there is no discomfort while doing it, then that is ok. If he has discomfort then he should stop. At 3 months from the date of surgery the sternum should be completely healed like it was never broken. If he has any questions or concerns he will notify our office otherwise we feel no further surgical follow-up is warranted. He will continue to follow with his PCP and cardiologist for continued care. Thank you for the opportunity to participate in the  care of Ronnie Williams.         Review of Systems   Constitutional: Negative.   HENT: Negative.    Eyes: Negative.    Cardiovascular: Positive for chest pain.   Respiratory: Negative.    Endocrine: Negative.    Hematologic/Lymphatic: Negative.    Skin: Negative.    Musculoskeletal: Positive for neck pain.   Gastrointestinal: Negative.    Genitourinary: Negative.    Neurological: Negative.    Psychiatric/Behavioral: Negative.    Allergic/Immunologic: Negative.          Objective:         ? acetaminophen (TYLENOL) 325 mg tablet Take two tablets by mouth every 6 hours as needed.   ? allopurinoL (ZYLOPRIM) 100 mg tablet Take two tablets by mouth at bedtime daily. Take with food.   ? allopurinoL (ZYLOPRIM) 300 mg tablet Take one tablet by mouth daily. Take with food.   ? aspirin EC 81 mg tablet Take one tablet by mouth daily. Take with food.   ? coQ10 (ubiquinol) 100 mg cap Take one capsule by mouth at bedtime daily.   ? fexofenadine (ALLEGRA) 180 mg tablet Take one tablet by mouth daily.   ? glucosamine-D3-Boswellia serr 1,500-400-100 mg-unit-mg tab Take 1 tablet by mouth daily.   ? MEN'S MULTI-VITAMIN PO Take 1 tablet by mouth daily.   ? metoprolol tartrate 25 mg tablet Take one-half tablet by mouth twice daily.   ? nitroglycerin (NITROSTAT) 0.4 mg tablet Place one tablet under tongue every 5 minutes as needed for Chest Pain. Max of 3 tablets, call 911.   ? rosuvastatin (CRESTOR) 20 mg tablet Take one tablet by mouth at bedtime daily.   ? senna/docusate (SENOKOT-S) 8.6/50 mg tablet Take two tablets by mouth twice daily.   ? sodium chloride (SEA MIST) 0.65 % nasal spray Apply two sprays to each nostril as directed as Needed.   ? sour cherry extract (TART CHERRY EXTRACT) 1,000 mg cap Take one capsule by mouth daily.   ? ticagrelor (BRILINTA) 90 mg tablet Take one tablet by mouth twice daily.   ? triamcinolone (NASACORT) 55 mcg nasal inhaler Apply two sprays to each nostril as directed daily.     Vitals:    07/02/21 1238   BP: (!) 140/80   BP Source: Arm, Left Upper   Pulse: 67   SpO2: 98%   PainSc: Four   Weight: 88 kg (194 lb)   Height: 170.2 cm (5' 7)     Body mass index is 30.38 kg/m?Marland Kitchen     Physical Exam  Constitutional:       Appearance: Normal appearance. He is obese.   Cardiovascular:      Rate and Rhythm: Normal rate and regular rhythm.      Pulses: Normal pulses.      Heart sounds: Normal heart sounds. No murmur heard.     Comments: Sternal and lower extremity incisions are well healed and well approximated without exudate, erythema, or swelling. Sternum is stable with cough.  Pulmonary:      Effort: Pulmonary effort is normal.      Breath sounds: Normal breath sounds.   Abdominal:      Palpations: Abdomen is soft.   Musculoskeletal:         General: Normal range of motion.      Right lower leg: No edema.      Left lower leg: No edema.   Skin:     General: Skin is warm and dry.  Neurological:      Mental Status: He is alert and oriented to person, place, and time.   Psychiatric:         Mood and Affect: Mood normal.         Behavior: Behavior normal.         Thought Content: Thought content normal.         Judgment: Judgment normal.              Assessment and Plan:  Overall, I am very pleased with Ronnie Williams's postoperative progress. At this time I do not feel that any further surgical follow up is warranted. However, should any issues or concerns arise, we would be more than happy to see him again. Thank you very much for allowing [place] to participate in his care. Please do not hesitate to contact us with any questions or concerns.     Jarome Lamas, APRN-NP  Thoracic & Cardiovascular Surgery  [phone]    07/02/2021

## 2021-07-10 ENCOUNTER — Ambulatory Visit: Admit: 2021-07-10 | Discharge: 2021-07-11 | Payer: MEDICARE

## 2021-07-10 ENCOUNTER — Encounter: Admit: 2021-07-10 | Discharge: 2021-07-10 | Payer: MEDICARE

## 2021-07-10 DIAGNOSIS — Z9229 Personal history of other drug therapy: Secondary | ICD-10-CM

## 2021-07-10 DIAGNOSIS — E7800 Pure hypercholesterolemia, unspecified: Secondary | ICD-10-CM

## 2021-07-10 DIAGNOSIS — K5792 Diverticulitis of intestine, part unspecified, without perforation or abscess without bleeding: Secondary | ICD-10-CM

## 2021-07-10 DIAGNOSIS — I2510 Atherosclerotic heart disease of native coronary artery without angina pectoris: Secondary | ICD-10-CM

## 2021-07-10 DIAGNOSIS — M109 Gout, unspecified: Secondary | ICD-10-CM

## 2021-07-10 DIAGNOSIS — K429 Umbilical hernia without obstruction or gangrene: Secondary | ICD-10-CM

## 2021-07-10 DIAGNOSIS — G629 Polyneuropathy, unspecified: Secondary | ICD-10-CM

## 2021-07-10 DIAGNOSIS — J302 Other seasonal allergic rhinitis: Secondary | ICD-10-CM

## 2021-07-10 DIAGNOSIS — M1990 Unspecified osteoarthritis, unspecified site: Secondary | ICD-10-CM

## 2021-07-10 DIAGNOSIS — I10 Essential (primary) hypertension: Secondary | ICD-10-CM

## 2021-07-10 DIAGNOSIS — I779 Disorder of arteries and arterioles, unspecified: Secondary | ICD-10-CM

## 2021-07-10 DIAGNOSIS — G4733 Obstructive sleep apnea (adult) (pediatric): Secondary | ICD-10-CM

## 2021-07-10 DIAGNOSIS — I639 Cerebral infarction, unspecified: Secondary | ICD-10-CM

## 2021-07-10 DIAGNOSIS — I7789 Other specified disorders of arteries and arterioles: Secondary | ICD-10-CM

## 2021-07-10 DIAGNOSIS — I671 Cerebral aneurysm, nonruptured: Secondary | ICD-10-CM

## 2021-07-10 DIAGNOSIS — I729 Aneurysm of unspecified site: Secondary | ICD-10-CM

## 2021-07-10 DIAGNOSIS — I219 Acute myocardial infarction, unspecified: Secondary | ICD-10-CM

## 2021-07-25 ENCOUNTER — Encounter: Admit: 2021-07-25 | Discharge: 2021-07-25 | Payer: MEDICARE

## 2021-07-25 DIAGNOSIS — Z9889 Other specified postprocedural states: Secondary | ICD-10-CM

## 2021-07-25 NOTE — Progress Notes
Post op PT rx and protocol (RCR with biceps tenodesis) faxed to [facility] at [phone].  Patient missed time due to cardiac surgery; he will be re-assessed and progress per appropriate stage of protocol

## 2021-07-28 ENCOUNTER — Encounter: Admit: 2021-07-28 | Discharge: 2021-07-28 | Payer: MEDICARE

## 2021-07-28 MED ORDER — METOPROLOL TARTRATE 25 MG PO TAB
12.5 mg | ORAL_TABLET | Freq: Two times a day (BID) | ORAL | 3 refills | 90.00000 days | Status: AC
Start: 2021-07-28 — End: ?

## 2021-07-28 MED ORDER — TICAGRELOR 90 MG PO TAB
90 mg | ORAL_TABLET | Freq: Two times a day (BID) | ORAL | 3 refills | Status: AC
Start: 2021-07-28 — End: ?

## 2021-08-25 ENCOUNTER — Encounter: Admit: 2021-08-25 | Discharge: 2021-08-25 | Payer: MEDICARE

## 2021-08-25 ENCOUNTER — Ambulatory Visit: Admit: 2021-08-25 | Discharge: 2021-08-26 | Payer: MEDICARE

## 2021-08-25 NOTE — Patient Instructions
J. Paul Schroeppel, MD  Seth Morgan, PA-C  [facility] - Phone [phone] - Fax [phone]   [address], [address] - [place], [place] [address]  Breklyn Fabrizio, RN - Clinical Nurse Coordinator  Drew Hutchison, ATC - Clinical Athletic Trainer    Please call [phone] for follow up appointments

## 2021-08-26 ENCOUNTER — Encounter: Admit: 2021-08-26 | Discharge: 2021-08-26 | Payer: MEDICARE

## 2021-08-26 ENCOUNTER — Ambulatory Visit: Admit: 2021-08-26 | Discharge: 2021-08-27 | Payer: MEDICARE

## 2021-08-26 DIAGNOSIS — I2510 Atherosclerotic heart disease of native coronary artery without angina pectoris: Secondary | ICD-10-CM

## 2021-08-26 DIAGNOSIS — I639 Cerebral infarction, unspecified: Secondary | ICD-10-CM

## 2021-08-26 DIAGNOSIS — G629 Polyneuropathy, unspecified: Secondary | ICD-10-CM

## 2021-08-26 DIAGNOSIS — Z9229 Personal history of other drug therapy: Secondary | ICD-10-CM

## 2021-08-26 DIAGNOSIS — I779 Disorder of arteries and arterioles, unspecified: Secondary | ICD-10-CM

## 2021-08-26 DIAGNOSIS — I671 Cerebral aneurysm, nonruptured: Secondary | ICD-10-CM

## 2021-08-26 DIAGNOSIS — I219 Acute myocardial infarction, unspecified: Secondary | ICD-10-CM

## 2021-08-26 DIAGNOSIS — Z951 Presence of aortocoronary bypass graft: Secondary | ICD-10-CM

## 2021-08-26 DIAGNOSIS — E7800 Pure hypercholesterolemia, unspecified: Secondary | ICD-10-CM

## 2021-08-26 DIAGNOSIS — I729 Aneurysm of unspecified site: Secondary | ICD-10-CM

## 2021-08-26 DIAGNOSIS — K5792 Diverticulitis of intestine, part unspecified, without perforation or abscess without bleeding: Secondary | ICD-10-CM

## 2021-08-26 DIAGNOSIS — I25118 Atherosclerotic heart disease of native coronary artery with other forms of angina pectoris: Secondary | ICD-10-CM

## 2021-08-26 DIAGNOSIS — J302 Other seasonal allergic rhinitis: Secondary | ICD-10-CM

## 2021-08-26 DIAGNOSIS — I10 Essential (primary) hypertension: Secondary | ICD-10-CM

## 2021-08-26 DIAGNOSIS — M1990 Unspecified osteoarthritis, unspecified site: Secondary | ICD-10-CM

## 2021-08-26 DIAGNOSIS — M25551 Pain in right hip: Secondary | ICD-10-CM

## 2021-08-26 DIAGNOSIS — M109 Gout, unspecified: Secondary | ICD-10-CM

## 2021-08-26 DIAGNOSIS — I7789 Other specified disorders of arteries and arterioles: Secondary | ICD-10-CM

## 2021-08-26 DIAGNOSIS — M545 Low back pain, unspecified back pain laterality, unspecified chronicity, unspecified whether sciatica present: Secondary | ICD-10-CM

## 2021-08-26 DIAGNOSIS — K429 Umbilical hernia without obstruction or gangrene: Secondary | ICD-10-CM

## 2021-08-26 DIAGNOSIS — G4733 Obstructive sleep apnea (adult) (pediatric): Secondary | ICD-10-CM

## 2021-08-26 DIAGNOSIS — E7849 Other hyperlipidemia: Secondary | ICD-10-CM

## 2021-08-26 MED ORDER — LOSARTAN 50 MG PO TAB
50 mg | ORAL_TABLET | Freq: Every day | ORAL | 1 refills | 90.00000 days | Status: AC
Start: 2021-08-26 — End: ?

## 2021-08-26 NOTE — Patient Instructions
Thank you for visiting our office today.    We would like to make the following medication adjustments:     Start Losartan 50mg  daily  -Monitor blood pressure and heart rate over the next 2 months and call nursing line with readings.        Otherwise continue the same medications as you have been doing.          We will be pursuing the following tests after your appointment today:       Orders Placed This Encounter    BASIC METABOLIC PANEL    LIPID PROFILE    ALT (SGPT)    AMB REFERRAL TO SPINE CENTER    losartan (COZAAR) 50 mg tablet       **Have fasting labs drawn prior to next visit**    We will plan to see you back in 6 months.  Please call us in the meantime with any questions or concerns.        Please allow 5-7 business days for our providers to review your results. All normal results will go to MyChart. If you do not have Mychart, it is strongly recommended to get this so you can easily view all your results. If you do not have mychart, we will attempt to call you once with normal lab and testing results. If we cannot reach you by phone with normal results, we will send you a letter.  If you have not heard the results of your testing after one week please give [place] a call.       Your Cardiovascular Medicine [place] Team Brett Canales, Pilar Jarvis, Shawna Orleans, and Sheldon)  phone number is [phone].

## 2021-08-26 NOTE — Progress Notes
Date of Service: 08/26/2021    Ronnie Williams is a 66 y.o. male.       HPI    Mr. Ronnie Williams is presently doing well.  Following coronary bypass surgery in January 2023 he has been enrolled in cardiac rehab in [place]. He attends 3 times a week and does the NuStep for 20 minutes as well as the treadmill for 20 minutes.  He completes cardiac rehab at the end of April 2023.   Mr. Ronnie Williams brought in a log book of his blood pressure readings.  His systolic blood pressure usually runs 130 to 140 mmHg.  He is still having some discomfort in his lower back and hips which he reports his previously been attributed to a bulging disc.  Otherwise, the patient has been doing well and reports no angina, congestive symptoms, palpitations, sensation of sustained forceful heart pounding, lightheadedness or syncope.??His exercise tolerance has been stable. The patient reports no claudication, bleeding abnormalities, or?new?strokelike symptoms.  Mr. Ronnie Williams developed muscle aches previously on Plavix and is Plavix intolerant.  He needs to continue taking Brilinta for at least 1 year following his myocardial infarction in October 2022 and this medication is medically necessary.  Mr. 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  ?  Historically,?Mr. Ronnie Williams?reports having a stroke?in 2016 and received tPA?with total recovery according to the patient. ?He was placed on aspirin and Plavix at the time but indicates he did not tolerate Plavix because of muscle aches. ?In?January 2022?while on vacation in [place], he?suffered another stroke which affected fine motor dexterity in his left hand. 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 Ronnie Williams indicates that he presented too late to receive tPA but fortunately had full recovery over time. ?He was placed on statin therapy at that time. ?The patient has also been followed for?hypertension, dyslipidemia, OSA (intermittently on CPAP),?and?diverticulitis status post prior colon resection. On 02/28/2021 Mr. Ronnie Williams underwent right shoulder arthroscopy, rotator cuff repair and biceps?tenotomy?for treatment of a full-thickness rotator cuff tear, biceps tendinitis, and AC arthritis.  Mr.Ronnie Williams? experienced an acute myocardial infarction on 03/04/2021 with a troponin of 17.5.  He actually presented with back discomfort.?He was transferred to [facility] hospital and underwent emergent stenting of the mid left circumflex coronary artery which was 99% obstructed. 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  He experienced chest discomfort on June 19, 2021 and coronary angiography performed on June 21, 2021 showed a patent stent in the obtuse marginal as well as patent bypass grafts.  There was no evidence for an acute coronary syndrome and no intervention was required.        Vitals:    08/26/21 1014   BP: (!) 144/80   BP Source: Arm, Left Upper   Pulse: 62   SpO2: 97%   O2 Percent: 97 %   O2 Device: None (Room air)   PainSc: Seven   Weight: 89.8 kg (198 lb)   Height: 170.2 cm (5' 7)     Body mass index is 31.01 kg/m?Marland Kitchen     Past Medical History  Patient Active Problem List    Diagnosis Date Noted   ? Tooth  abscess 06/20/2021   ? Unstable angina (HCC) 06/19/2021   ? Chest pain 06/19/2021   ? Hyponatremia 06/12/2021   ? Hypokalemia 06/10/2021   ? Thrombocytopenia (HCC) 06/06/2021   ? Acute blood loss anemia 06/06/2021   ? AKI (acute kidney injury) (HCC) 06/06/2021   ? 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 Stress hyperglycemia 06/05/2021   ? History of stroke 06/04/2021   ? S/P coronary artery stent placement 03/26/2021     To circumflex artery     ? HX: anticoagulation 03/26/2021   ? Diverticulitis 03/26/2021   ? Carotid artery disease (HCC) 03/26/2021     CTO Left, and 50% blockage on Right carotid     ? 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 S/P rotator cuff repair 03/18/2021   ? Coronary artery disease of native artery of native heart with stable angina pectoris [facility]) 03/06/2021     03/04/21: STEMI/Cath - The mid left circumflex artery has a 99% stenosis.  This is the culprit lesion for the patient's current presentation. The ostium of the LAD has a 70% stenosis.  The mid LAD has 60-70% long segment of stenosis. The 2nd diagonal branch has 70-80% stenosis in the proximal segment. Successful PCI of the mid left circumflex artery with 4.0 x 15 mm Onyx Frontier DES. Measurement of RFR across the LAD was 0.63 suggesting that the stenosis is hemodynamically significant. Intravascular ultrasound of the left main coronary artery revealed minimal lumen area of 20.8 sq mm.  06/05/21: Coronary artery bypass grafting x 2 (LIMA to LAD, SVG to Diag) & implantable loop recorder removal.  ?       ? Acute ST elevation myocardial infarction (STEMI) (HCC) 03/04/2021   ? ST elevation myocardial infarction (STEMI) (HCC) 03/04/2021     1. 03/04/21- Coronary Angiogram- The mid left circumflex artery has a 99% stenosis.  This is the culprit lesion for the patient's current presentation.  2. The ostium of the left anterior descending artery has a 70% stenosis.  The mid left anterior descending artery has 60% to 70% long segment of stenosis.  3. The 2nd diagonal branch has 70% to 80% stenosis in the proximal segment.  4. Elevated left ventricular end-diastolic pressure.  5. No significant gradient across the aortic valve on pullback.  6. Successful percutaneous coronary intervention of the mid left circumflex artery with 4.0 x 15 mm Onyx Frontier drug-eluting stent postdilated with 4.5 NC.  7. Measurement of RFR across the left anterior descending artery was 0.63 suggesting that the stenosis is hemodynamically significant.  8. Intravascular ultrasound of the left main coronary artery revealed minimal lumen area of 20.8 sq mm.       ? Right rotator cuff tear 02/28/2021   ? OSA (obstructive sleep apnea) 02/11/2021   ? Intolerance of continuous positive airway pressure (CPAP) ventilation 02/11/2021   ? BMI 32.0-32.9,adult 02/11/2021   ? Traumatic complete tear of right rotator cuff 01/13/2021   ? Biceps tendinitis of right shoulder 01/13/2021   ? 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 Ear fullness, left 05/08/2020   ? Family history of chronic ischemic heart disease 08/05/2016   ? Shortness of breath 08/05/2016   ? Other hyperlipidemia 08/05/2016   ? Essential hypertension 08/05/2016   ? Eustachian tube dysfunction, left 04/29/2016         Review of Systems   Constitutional: Positive for malaise/fatigue.   HENT: Negative.    Eyes: Negative.    Cardiovascular: Negative.  Respiratory: Negative.    Endocrine: Negative.    Hematologic/Lymphatic: Negative.    Skin: Negative.    Musculoskeletal: Positive for back pain and muscle weakness.   Gastrointestinal: Negative.    Genitourinary: Negative.    Neurological: Negative.    Psychiatric/Behavioral: Negative.    Allergic/Immunologic: Negative.        Physical Exam  GENERAL: The patient is well developed, well nourished, resting comfortably and in no distress.   HEENT: No abnormalities of the visible oro-nasopharynx, conjunctiva or sclera are noted.  NECK: There is no jugular venous distension. Carotids are palpable and without bruits. There is no thyroid enlargement.  Chest: Lung fields are clear to auscultation. There are no wheezes or crackles.  CV: There is a regular rhythm. The first and second heart sounds are normal. There are no murmurs, gallops or rubs.  ABD: The abdomen is soft and supple with normal bowel sounds. There is no hepatosplenomegaly, ascites, tenderness, masses or bruits.  Neuro: There are no focal motor defects. Ambulation is normal. Cognitive function appears normal.  Ext:?There is no edema or evidence of deep vein thrombosis. Peripheral pulses are satisfactory.  SKIN:?There are no rashes and no cellulitis.  His sternal incision looks very good and his left leg endoscopic vein site harvest looks very good.  PSYCH:?The patient is calm, rationale and oriented.    Cardiovascular Studies  A twelve-lead ECG obtained on June 19, 2021 reveals normal sinus rhythm with a heart rate of 70 bpm.  There is evidence of inferior infarct, age old or indeterminate with Q waves in the inferior leads.    Labs from 06/21/2021 revealed serum potassium 3.4 mmol/L and serum creatinine 1.09 mg/dL.    Echo Doppler 06/20/2021:  Interpretation Summary  ?  1. Slightly dilated left ventricle based on Biplane method-of-discs calculation. ?Normal ejection fraction. ?Visually, left ventricular ejection fraction=60%. ?There is basal inferolateral and mid inferolateral hypokinesis. ?There is abnormal septal motion probably due to postoperative state.  2. Normal left ventricular diastolic function and left atrial pressure.  3. Normal right ventricle size and probably normal function.  4. Mild biatrial enlargement.  5. Normal central venous pressure.  6. Mild mitral annular calcification with nonspecific thickening of the mitral valve. ?No stenosis.  7. Unable to calculate systolic pulmonary artery pressure due to inadequate tricuspid valve regurgitant signal.  8. No pericardial effusion.  ?  A direct visual comparison was performed with the most recent transthoracic echocardiogram from March 04, 2021. ?There is new abnormal septal motion probably due to postoperative state compared to the prior study. ?Otherwise, no other significant interval changes.  Echocardiographic Findings  ?  Left Ventricle The left ventricle is mildly dilated. Mildly thickened basal septum with otherwise normal geometry. The left ventricular systolic function is normal. The visually estimated ejection fraction is 60%. The ejection fraction by Simpson's biplane method is 58%. Abnormal septal motion consistent with post-operative state. Normal left ventricular diastolic function. Normal left atrial pressure.   Right Ventricle The right ventricular size is normal. Quantitative parameters would suggest probably mildly reduced systolic function; however, based on visual estimation right ventricular systolic pressure is probably low normal. ?See image 49. The pulmonary artery pressure could not be estimated due to inadequate tricuspid regurgitation signal.   Left Atrium Mildly dilated.   Right Atrium Mildly dilated.   IVC/SVC Normal central venous pressure (0-5 mm Hg).   Mitral Valve Non-specific thickening. No stenosis. Trace regurgitation. There is mild mitral annular calcification.   Tricuspid Valve Normal valve  structure. No stenosis. Trace regurgitation.   Aortic Valve Normal valve structure. Trileaflet valve. No stenosis. No regurgitation.   Pulmonary The pulmonic valve was not seen well but no Doppler evidence of stenosis. Trace regurgitation.   Aorta The aortic root and ascending aorta are normal in size.   Pericardium No pericardial effusion.         Cardiovascular Health Factors  Vitals BP Readings from Last 3 Encounters:   08/26/21 (!) 144/80   07/10/21 (!) 148/66   07/02/21 (!) 140/80     Wt Readings from Last 3 Encounters:   08/26/21 89.8 kg (198 lb)   07/10/21 88 kg (194 lb)   07/02/21 88 kg (194 lb)     BMI Readings from Last 3 Encounters:   08/26/21 31.01 kg/m?   07/10/21 30.38 kg/m?   07/02/21 30.38 kg/m?      Smoking Social History     Tobacco Use   Smoking Status Never   Smokeless Tobacco Never      Lipid Profile Cholesterol   Date Value Ref Range Status   03/04/2021 90 <200 MG/DL Final     HDL   Date Value Ref Range Status   03/04/2021 42 >40 MG/DL Final     LDL   Date Value Ref Range Status   03/04/2021 43 <100 mg/dL Final     Triglycerides   Date Value Ref Range Status 03/04/2021 45 <150 MG/DL Final      Blood Sugar Hemoglobin A1C   Date Value Ref Range Status   06/04/2021 6.1 (H) 4.0 - 6.0 % Final     Comment:     The ADA recommends that most patients with type 1 and type 2 diabetes maintain   an A1c level <7%.       Glucose   Date Value Ref Range Status   06/21/2021 102 (H) 70 - 100 MG/DL Final   16/02/9603 540 (H) 70 - 100 MG/DL Final   98/03/9146 829 (H) 70 - 100 MG/DL Final     Glucose, POC   Date Value Ref Range Status   06/11/2021 117 (H) 70 - 100 MG/DL Final   56/21/3086 578 (H) 70 - 100 MG/DL Final   46/96/2952 841 (H) 70 - 100 MG/DL Final          Problems Addressed Today  Encounter Diagnoses   Name Primary?   ? Other hyperlipidemia Yes   ? Essential hypertension    ? Coronary artery disease of native artery of native heart with stable angina pectoris (HCC)    ? S/P CABG x 2    ? Low back pain, unspecified back pain laterality, unspecified chronicity, unspecified whether sciatica present    ? Bilateral hip pain        Assessment and Plan    Mr. Ronnie Williams appears to be doing very well following coronary stenting in October 2022 and coronary bypass surgery in January 2023.  He would like to continue his Brilinta until October 2023, 1 year after suffering his myocardial infarction.  He understands that he will continue aspirin indefinitely.  I commended him for participating in cardiac rehab.  It appears that his blood pressure has been mildly elevated.  Alternatives for the treatment of hypertension were reviewed with the patient and he wanted to start losartan 50 mg daily.  Possible adverse effects associated with use of losartan were reviewed with the patient and he was asked to report any worrisome symptoms.  I have asked the patient to  continue to keep a log book of his BP readings and to report BP readings exceeding 130/80 mm Hg.   I have asked him to report his blood pressure readings within the next 2 months regardless.  The patient is wondering whether some of his back and hip discomfort may be due to rosuvastatin.  However, it is possible that these symptoms of lower back and hip discomfort are related to structural disease since he has a prior history of a bulging disc.  I referred him to the spine center at [facility].  Mr. Ronnie Williams developed muscle aches previously on Plavix and is Plavix intolerant.  He needs to continue taking Brilinta for at least 1 year following his myocardial infarction in October 2022 and this medication is medically necessary. I have asked him to return for follow-up in 6 months time.  I have asked him to obtain a Chem-7, fasting lipid profile and ALT before his next clinic visit.  The total time spent during this interview and exam with preparation and chart review was 30 minutes.         Current Medications (including today's revisions)  ? acetaminophen (TYLENOL) 325 mg tablet Take two tablets by mouth every 6 hours as needed.   ? allopurinoL (ZYLOPRIM) 100 mg tablet Take two tablets by mouth at bedtime daily. Take with food.   ? allopurinoL (ZYLOPRIM) 300 mg tablet Take one tablet by mouth daily. Take with food.   ? aspirin EC 81 mg tablet Take one tablet by mouth daily. Take with food.   ? coQ10 (ubiquinol) 100 mg cap Take one capsule by mouth at bedtime daily.   ? fexofenadine (ALLEGRA) 180 mg tablet Take one tablet by mouth daily.   ? glucosamine-D3-Boswellia serr 1,500-400-100 mg-unit-mg tab Take 1 tablet by mouth daily.   ? MEN'S MULTI-VITAMIN PO Take 1 tablet by mouth daily.   ? metoprolol tartrate 25 mg tablet Take one-half tablet by mouth twice daily.   ? nitroglycerin (NITROSTAT) 0.4 mg tablet Place one tablet under tongue every 5 minutes as needed for Chest Pain. Max of 3 tablets, call 911.   ? rosuvastatin (CRESTOR) 20 mg tablet Take one tablet by mouth at bedtime daily.   ? senna/docusate (SENOKOT-S) 8.6/50 mg tablet Take two tablets by mouth twice daily. (Patient taking differently: Take two tablets by mouth as Needed.)   ? sodium chloride (SEA MIST) 0.65 % nasal spray Apply two sprays to each nostril as directed as Needed.   ? sour cherry extract (TART CHERRY EXTRACT) 1,000 mg cap Take one capsule by mouth daily.   ? ticagrelor (BRILINTA) 90 mg tablet Take one tablet by mouth twice daily.   ? triamcinolone (NASACORT) 55 mcg nasal inhaler Apply two sprays to each nostril as directed daily.

## 2021-09-03 ENCOUNTER — Encounter: Admit: 2021-09-03 | Discharge: 2021-09-03 | Payer: MEDICARE

## 2021-09-03 NOTE — Telephone Encounter
Pre Visit Planning- New Patient    Left voicemail message.    Patient active in MyChart. MyChart message sent with appointment reminders and instructions. .     no imaging LVM      Updated chart: Not assessed    New patient paperwork complete: Yes

## 2021-09-04 ENCOUNTER — Encounter: Admit: 2021-09-04 | Discharge: 2021-09-04 | Payer: MEDICARE

## 2021-09-04 ENCOUNTER — Ambulatory Visit: Admit: 2021-09-04 | Discharge: 2021-09-04 | Payer: MEDICARE

## 2021-09-04 DIAGNOSIS — I219 Acute myocardial infarction, unspecified: Secondary | ICD-10-CM

## 2021-09-04 DIAGNOSIS — G4733 Obstructive sleep apnea (adult) (pediatric): Secondary | ICD-10-CM

## 2021-09-04 DIAGNOSIS — M1990 Unspecified osteoarthritis, unspecified site: Secondary | ICD-10-CM

## 2021-09-04 DIAGNOSIS — G629 Polyneuropathy, unspecified: Secondary | ICD-10-CM

## 2021-09-04 DIAGNOSIS — I639 Cerebral infarction, unspecified: Secondary | ICD-10-CM

## 2021-09-04 DIAGNOSIS — K429 Umbilical hernia without obstruction or gangrene: Secondary | ICD-10-CM

## 2021-09-04 DIAGNOSIS — I779 Disorder of arteries and arterioles, unspecified: Secondary | ICD-10-CM

## 2021-09-04 DIAGNOSIS — M48062 Spinal stenosis, lumbar region with neurogenic claudication: Secondary | ICD-10-CM

## 2021-09-04 DIAGNOSIS — K5792 Diverticulitis of intestine, part unspecified, without perforation or abscess without bleeding: Secondary | ICD-10-CM

## 2021-09-04 DIAGNOSIS — J302 Other seasonal allergic rhinitis: Secondary | ICD-10-CM

## 2021-09-04 DIAGNOSIS — Z9229 Personal history of other drug therapy: Secondary | ICD-10-CM

## 2021-09-04 DIAGNOSIS — I10 Essential (primary) hypertension: Secondary | ICD-10-CM

## 2021-09-04 DIAGNOSIS — M461 Sacroiliitis, not elsewhere classified: Secondary | ICD-10-CM

## 2021-09-04 DIAGNOSIS — E7800 Pure hypercholesterolemia, unspecified: Secondary | ICD-10-CM

## 2021-09-04 DIAGNOSIS — I2510 Atherosclerotic heart disease of native coronary artery without angina pectoris: Secondary | ICD-10-CM

## 2021-09-04 DIAGNOSIS — M109 Gout, unspecified: Secondary | ICD-10-CM

## 2021-09-04 DIAGNOSIS — I729 Aneurysm of unspecified site: Secondary | ICD-10-CM

## 2021-09-04 DIAGNOSIS — I671 Cerebral aneurysm, nonruptured: Secondary | ICD-10-CM

## 2021-09-04 DIAGNOSIS — I7789 Other specified disorders of arteries and arterioles: Secondary | ICD-10-CM

## 2021-09-04 MED ORDER — CYCLOBENZAPRINE 5 MG PO TAB
5 mg | ORAL_TABLET | Freq: Every evening | ORAL | 0 refills | 30.00000 days | Status: AC | PRN
Start: 2021-09-04 — End: ?

## 2021-09-04 NOTE — Progress Notes
SPINE CENTER HISTORY AND PHYSICAL    Chief Complaint:   Chief Complaint   Patient presents with   ? New Patient     back and hip pain       Subjective     HISTORY OF PRESENT ILLNESS:   Ronnie Williams is a 66 y.o. 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 who presents for evaluation.Patient is presenting with a longstanding history of low back pain.  He feels like it is gotten worse starting certain cardiac medications.  He is has a pretty extensive history of coronary artery disease status post CABG x2, MI, history of stroke.  He reports the pain is midline low back without any significant radiation.  Sometimes it radiates into bilateral buttocks.  He reports pain is constant nature, sharp shooting achy.  Pain is worse when he is sitting in 1 position for a while and tries to stand up and walk.  He feels stiff and sore.  Getting out of bed in the morning is difficult.  He is unable to sleep on his back.  He has had issues with the spine in the past, he had an MRI in 2022 which showed multiple levels of pretty significant stenosis both central canal and neural foramen.  He is been managing this with just physical therapy, a few injections and no surgery.  He is unable to take NSAIDs.  He is taking Tylenol.  He has not done any physical therapy recently.             Ronnie Williams denies any recent fevers, chills, infection, antibiotics, bowel or bladder incontinence, saddle anesthesia, bleeding issues, or recent anticoagulant.     ROS:   A 10 point review of systems is negative except for what is noted in the HPI above.    Past Medical History:  Medical History:   Diagnosis Date   ? Aneurysm (HCC)    ? Arthritis    ? Carotid artery disease (HCC)     CTO Left, and 50% blockage on Right carotid   ? Cerebral aneurysm    ? Coronary artery disease    ? Diverticulitis    ? Gout    ? Heart attack (HCC)    ? High cholesterol    ? HX: anticoagulation    ? Hypertension    ? Neuropathy    ? OSA on CPAP    ? Other specified disorders of arteries and arterioles (HCC)    ? Seasonal allergic reaction    ? Stroke [facility]) 2014   ? Stroke [facility]) 06/2020   ? Umbilical hernia        Family History:  Family History   Problem Relation Age of Onset   ? COPD Mother    ? High Cholesterol Father    ? Heart Attack Brother    ? Heart Disease Brother         s/p PCI with stent placement   ? Heart Disease Maternal Grandfather    ? Diabetes Daughter    ? Crohn's Disease Daughter        Social History:  Lives in [place] [address]    Social History     Socioeconomic History   ?  Marital status: Married   Tobacco Use   ? Smoking status: Never   ? Smokeless tobacco: Never   Vaping Use   ? Vaping Use: Never used   Substance and Sexual Activity   ? Alcohol use: Yes     Alcohol/week: 1.0 standard drink     Types: 1 Cans of beer per week     Comment: 1 drink per week   ? Drug use: No       Allergies:  Allergies   Allergen Reactions   ? Doxycycline MUSCLE PAIN   ? Colchicine DIARRHEA and NAUSEA ONLY     Body aches, diarrhea, nausea   ? Latex ITCHING, SEE COMMENTS and REDNESS     With prolonged exposure   ? Levaquin [Levofloxacin] SEE COMMENTS     Body aches and shakes   ? Plavix [Clopidogrel] SEE COMMENTS     Joint pain and muscle pain   ? Sulfa (Sulfonamide Antibiotics) ITCHING       Medications:    Current Outpatient Medications:   ?  acetaminophen (TYLENOL) 325 mg tablet, Take two tablets by mouth every 6 hours as needed., Disp: , Rfl: 0  ?  allopurinoL (ZYLOPRIM) 100 mg tablet, Take two tablets by mouth at bedtime daily. Take with food., Disp: , Rfl:   ?  allopurinoL (ZYLOPRIM) 300 mg tablet, Take one tablet by mouth daily. Take with food., Disp: , Rfl:   ?  aspirin EC 81 mg tablet, Take one tablet by mouth daily. Take with food., Disp: , Rfl:   ?  coQ10 (ubiquinol) 100 mg cap, Take one capsule by mouth at bedtime daily., Disp: , Rfl:   ?  fexofenadine (ALLEGRA) 180 mg tablet, Take one tablet by mouth daily., Disp: , Rfl:   ?  glucosamine-D3-Boswellia serr 1,500-400-100 mg-unit-mg tab, Take 1 tablet by mouth daily., Disp: , Rfl:   ?  losartan (COZAAR) 50 mg tablet, Take one tablet by mouth daily., Disp: 90 tablet, Rfl: 1  ?  MEN'S MULTI-VITAMIN PO, Take 1 tablet by mouth daily., Disp: , Rfl:   ?  metoprolol tartrate 25 mg tablet, Take one-half tablet by mouth twice daily., Disp: 60 tablet, Rfl: 3  ?  nitroglycerin (NITROSTAT) 0.4 mg tablet, Place one tablet under tongue every 5 minutes as needed for Chest Pain. Max of 3 tablets, call 911., Disp: , Rfl:   ?  rosuvastatin (CRESTOR) 20 mg tablet, Take one tablet by mouth at bedtime daily., Disp: , Rfl:   ?  senna/docusate (SENOKOT-S) 8.6/50 mg tablet, Take two tablets by mouth twice daily. (Patient taking differently: Take two tablets by mouth as Needed.), Disp: 90 tablet, Rfl: 0  ?  sodium chloride (SEA MIST) 0.65 % nasal spray, Apply two sprays to each nostril as directed as Needed., Disp: , Rfl:   ?  sour cherry extract (TART CHERRY EXTRACT) 1,000 mg cap, Take one capsule by mouth daily., Disp: , Rfl:   ?  ticagrelor (BRILINTA) 90 mg tablet, Take one tablet by mouth twice daily., Disp: 60 tablet, Rfl: 3  ?  triamcinolone (NASACORT) 55 mcg nasal inhaler, Apply two sprays to each nostril as directed daily., Disp: , Rfl:     Physical examination:   There were no vitals taken for this visit.  Pain Score: Six    Gen: Alert & Oriented X 3  HEENT: EOMI  Neck: Supple, no elevated JVP  Heart: Extremities well perfused  Lungs: non labored breathing  Abdomen: Soft, non-tender, non-distended  Skin: no gross lesions appreciated  Ext: purposeful movement of extremities     LOWER EXTREMITIES  MS:   Root Right Left   Hip Flexion L2 5 5   Knee Flexion L5/S1 5 5   Knee Extension L3 5 5   Dorsiflexion L4 5 5   Plantarflexion S1 5 5   EHL Extension L5 5 5     Gait was smooth and symmetric with equal arm swing.        No pain reproduced with facet loading.    No tenderness to palpation along the spinous process, facet joints, paraspinal musculature,  gluteal musculature, greater trochanters.    Tender palpation bilateral    Patient is able to forward flex to knees, and able to extend without significant pain.    Full ROM bilateral lower extremities.      Negative slump testing.  Negative straight leg testing to 30-75 degrees.      Negative FADIR testing.  Positive bilateral Patrick-FABER testing.  Positive bilateral Advice worker.   Positive bilateral Gaenslens testing.  Positive bilateral SI compression    Neuro:  DTR's 2+ right patella, 2+ left patella  2+ right achilles, 2+ left achilles   Lower Extremity Tone Normal   Lower Extremity Sensation Intact to light touch bilaterally     DIAGNOSTICS:  MRI L-spine showing multiple levels of disc bulge with facet arthropathy causing neuroforaminal stenosis from L3 down to S1, central canal stenosis worse at L5-S1.      Last Cr and LFT's:  Creatinine   Date Value Ref Range Status   06/21/2021 1.09 0.4 - 1.24 MG/DL Final     AST (SGOT)   Date Value Ref Range Status   06/21/2021 21 7 - 40 U/L Final     ALT (SGPT)   Date Value Ref Range Status   06/21/2021 22 7 - 56 U/L Final     Alk Phosphatase   Date Value Ref Range Status   06/21/2021 102 25 - 110 U/L Final     Total Bilirubin   Date Value Ref Range Status   06/21/2021 0.5 0.3 - 1.2 MG/DL Final            Assessment:  The pain complaints are most likely due to:  1. Sacroiliitis (HCC)    2. Lumbar stenosis with neurogenic claudication      Dejean Tribby is a 66 y.o. 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 who presents for evaluation of pain.    Plan:  1.  Lifestyle modification.  Continue current activities as tolerated.    2.  Medication.    -Flexeril 5 mg nightly as needed  3.  Therapy.    - Patient provided prescription for formalized physical therapy to work on lumbar/sacral stabilization exercises, soft tissue mobilization, stretching techniques, as well as modalities such as TENS unit, traction, therapeutic ultrasound, and Grasston technique.  4.  Interventions.  No interventions planned at this time.  Consider bilateral sacroiliac joint injections.  5.  Diagnostics.  No new imaging to be ordered at this time.  6.  Follow-up.  Patient will follow-up 6 weeks.    Risks/benefits of all pharmacologic and interventional treatments discussed and questions answered.  Thank you for this kind referral for consultation. Please feel free to contact me with any questions or concerns.

## 2021-09-15 ENCOUNTER — Ambulatory Visit: Admit: 2021-09-15 | Discharge: 2021-09-15 | Payer: MEDICARE

## 2021-09-15 ENCOUNTER — Encounter: Admit: 2021-09-15 | Discharge: 2021-09-15 | Payer: MEDICARE

## 2021-09-15 DIAGNOSIS — I729 Aneurysm of unspecified site: Secondary | ICD-10-CM

## 2021-09-15 LAB — POC CREATININE, RAD: CREATININE, POC: 1.1 mg/dL (ref 0.4–1.24)

## 2021-09-15 MED ORDER — IOHEXOL 350 MG IODINE/ML IV SOLN
60 mL | Freq: Once | INTRAVENOUS | 0 refills | Status: CP
Start: 2021-09-15 — End: ?
  Administered 2021-09-15: 21:00:00 60 mL via INTRAVENOUS

## 2021-09-15 MED ORDER — SODIUM CHLORIDE 0.9 % IJ SOLN
50 mL | Freq: Once | INTRAVENOUS | 0 refills | Status: CP
Start: 2021-09-15 — End: ?
  Administered 2021-09-15: 21:00:00 50 mL via INTRAVENOUS

## 2021-09-17 ENCOUNTER — Encounter: Admit: 2021-09-17 | Discharge: 2021-09-17 | Payer: MEDICARE

## 2021-09-17 DIAGNOSIS — I729 Aneurysm of unspecified site: Secondary | ICD-10-CM

## 2021-09-17 NOTE — Patient Education
Interventional Radiology Progress Note    Recent CTA head reviewed by [name] for aneurysm surveillance. Per [name] imaging remains stable and aneurysm unchanged. Plan for patient to complete CTA head wo/w contrast in one year. Patient notified of imaging results and plan and VU. Order placed and our office to call patient 09/2021 for scheduling of CTA head.    Polo Riley, RN

## 2021-09-22 ENCOUNTER — Encounter: Admit: 2021-09-22 | Discharge: 2021-09-22 | Payer: MEDICARE

## 2021-09-22 NOTE — Telephone Encounter
Wife called to see if the patient could get rescheduled for his DISE as he had to cancel in the past due to having bypass surgery. Per cardiology note, they want him on his blood thinner until 1 year following his MI which would be October. Tentatively scheduled patient for procedure on 11/2 for DISE following his follow up appointment with cardiology.

## 2021-09-30 ENCOUNTER — Encounter: Admit: 2021-09-30 | Discharge: 2021-09-30 | Payer: MEDICARE

## 2021-09-30 ENCOUNTER — Ambulatory Visit: Admit: 2021-09-30 | Discharge: 2021-09-30 | Payer: MEDICARE

## 2021-09-30 DIAGNOSIS — K5732 Diverticulitis of large intestine without perforation or abscess without bleeding: Secondary | ICD-10-CM

## 2021-09-30 DIAGNOSIS — M109 Gout, unspecified: Secondary | ICD-10-CM

## 2021-09-30 DIAGNOSIS — I7789 Other specified disorders of arteries and arterioles: Secondary | ICD-10-CM

## 2021-09-30 DIAGNOSIS — Z8673 Personal history of transient ischemic attack (TIA), and cerebral infarction without residual deficits: Secondary | ICD-10-CM

## 2021-09-30 DIAGNOSIS — N200 Calculus of kidney: Secondary | ICD-10-CM

## 2021-09-30 DIAGNOSIS — K5792 Diverticulitis of intestine, part unspecified, without perforation or abscess without bleeding: Secondary | ICD-10-CM

## 2021-09-30 DIAGNOSIS — I219 Acute myocardial infarction, unspecified: Secondary | ICD-10-CM

## 2021-09-30 DIAGNOSIS — G629 Polyneuropathy, unspecified: Secondary | ICD-10-CM

## 2021-09-30 DIAGNOSIS — J302 Other seasonal allergic rhinitis: Secondary | ICD-10-CM

## 2021-09-30 DIAGNOSIS — M5136 Other intervertebral disc degeneration, lumbar region: Secondary | ICD-10-CM

## 2021-09-30 DIAGNOSIS — I10 Essential (primary) hypertension: Secondary | ICD-10-CM

## 2021-09-30 DIAGNOSIS — I779 Disorder of arteries and arterioles, unspecified: Secondary | ICD-10-CM

## 2021-09-30 DIAGNOSIS — M4800 Spinal stenosis, site unspecified: Secondary | ICD-10-CM

## 2021-09-30 DIAGNOSIS — G4733 Obstructive sleep apnea (adult) (pediatric): Secondary | ICD-10-CM

## 2021-09-30 DIAGNOSIS — Z9229 Personal history of other drug therapy: Secondary | ICD-10-CM

## 2021-09-30 DIAGNOSIS — I2510 Atherosclerotic heart disease of native coronary artery without angina pectoris: Secondary | ICD-10-CM

## 2021-09-30 DIAGNOSIS — I639 Cerebral infarction, unspecified: Secondary | ICD-10-CM

## 2021-09-30 DIAGNOSIS — M5030 Other cervical disc degeneration, unspecified cervical region: Secondary | ICD-10-CM

## 2021-09-30 DIAGNOSIS — M2550 Pain in unspecified joint: Secondary | ICD-10-CM

## 2021-09-30 DIAGNOSIS — I729 Aneurysm of unspecified site: Secondary | ICD-10-CM

## 2021-09-30 DIAGNOSIS — M1990 Unspecified osteoarthritis, unspecified site: Secondary | ICD-10-CM

## 2021-09-30 DIAGNOSIS — I671 Cerebral aneurysm, nonruptured: Secondary | ICD-10-CM

## 2021-09-30 DIAGNOSIS — E7800 Pure hypercholesterolemia, unspecified: Secondary | ICD-10-CM

## 2021-09-30 DIAGNOSIS — K429 Umbilical hernia without obstruction or gangrene: Secondary | ICD-10-CM

## 2021-09-30 DIAGNOSIS — M5134 Other intervertebral disc degeneration, thoracic region: Secondary | ICD-10-CM

## 2021-09-30 NOTE — Progress Notes
Date of Service: 09/30/2021     Subjective:               Ronnie Williams is a 66 y.o. male.        History of Present Illness    He follows up today for stroke in 2022.  Last time we talked about risk factors of his including hypertension, MI, prior stroke, cerebrovascular disease with carotid stenosis, hyperlipidemia and sleep apnea.      He underwent coronary bypass graft in January 2023.  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  Right carotid stenosis was asymptomatic so I suggested pursuing no intervention.  I recommended continuing LDL cholesterol control of less than 70 and A1c less than 6.5.  Continued aspirin 81 mg daily.    He has done cardiac rehab and finished it at the end of April 2023.  No new strokelike symptoms.  He developed muscle ache on Plavix and was found to be Plavix intolerant.  He is treated with Brilinta by cardiology.    He feels well.  No residual symptoms from prior stroke reported.  Denies unilateral weakness, numbness or imbalance.  No speech changes.  No new symptoms since last time.    He has no neurologic complaints and is here for routine follow-up.    Recent CTA head/neck May 2023: 2.5 mm aneurysm proximal right MCA inferior division.  Chronic occlusion cervical, petrous and cavernous left ICA.  Atheromatous irregularity and calcification left internal carotid artery.  Normal caliber remaining major intracranial arteries.  Old left parietal periatrial infarct.  Atheromatous irregularity and calcification right ICA.    CTA head April 2022: Occluded left internal carotid at origin.  Retrograde flow into the distal ICA.  Less than 50% stenosis right ICA.             Chief Complaint:  Chief Complaint   Patient presents with   ? Follow Up     6 mo FUV stroke; s/p CABG in Jan, 2023       Past Medical History:  Medical History:   Diagnosis Date   ? Aneurysm (HCC)    ? Arthritis    ? Carotid artery disease (HCC)     CTO Left, and 50% blockage on Right carotid ? Cerebral aneurysm    ? Coronary artery disease    ? Degenerative disc disease, cervical 2000   ? Degenerative disc disease, lumbar 2000   ? Degenerative disc disease, thoracic 2000   ? Diverticulitis    ? Diverticulitis of colon (without mention of hemorrhage)(562.11) 2007   ? Gout    ? Heart attack (HCC)    ? High cholesterol    ? HX: anticoagulation    ? Hypertension    ? Joint pain 2000   ? Kidney stones ???   ? Neuropathy    ? OSA on CPAP    ? Other specified disorders of arteries and arterioles (HCC)    ? Seasonal allergic reaction    ? Spinal stenosis ???   ? Stroke [facility]) 2014   ? Stroke [facility]) 06/2020   ? Umbilical hernia        Surgical History:  Surgical History:   Procedure Laterality Date   ? COLECTOMY  2007    colon resection   ? HX EAR TUBES  02/25/2016   ? right shoulder arthroscopy, rotator cuff repair Right 02/28/2021    Performed by Dolan Amen, MD at [facility]   ?  right shoulder arthroscopy,  distal clavicle excision Right 02/28/2021    Performed by Dolan Amen, MD at [facility]   ? right shoulder arthroscopy,  biceps tenotomy Right 02/28/2021    Performed by Dolan Amen, MD at [facility]   ? HX HEART CATHETERIZATION  03/04/2021    stent x2 placed   ? Coronary artery bypass grafting x 2 , internal mammary artery harvest, endoscopic left saphenous vein harvest, implantable loop recorder removal. N/A 06/05/2021    Performed by Virginia Crews, MD at [facility]   ? COLONOSCOPY     ? HX ADENOIDECTOMY     ? HX TONSILLECTOMY     ? UMBILICAL ARTERIAL CATH - BEDSIDE  03/04/21       Social History:   Social History     Socioeconomic History   ? Marital status: Married   Tobacco Use   ? Smoking status: Never   ? Smokeless tobacco: Never   Vaping Use   ? Vaping Use: Never used   Substance and Sexual Activity   ? Alcohol use: Yes     Alcohol/week: 2.0 standard drinks of alcohol     Types: 2 Cans of beer per week     Comment: 1 drink per week   ? Drug use: No   ? Sexual activity: Yes     Partners: Female     Birth control/protection: Surgical             Family History:  Family History   Problem Relation Age of Onset   ? COPD Mother    ? Arthritis Mother    ? Back pain Mother    ? High Cholesterol Father    ? Heart Attack Brother    ? Heart Disease Brother         s/p PCI with stent placement   ? Heart Disease Maternal Grandfather    ? Diabetes Daughter    ? Crohn's Disease Daughter        Allergies:  Allergies   Allergen Reactions   ? Doxycycline MUSCLE PAIN   ? Colchicine DIARRHEA and NAUSEA ONLY     Body aches, diarrhea, nausea   ? Latex ITCHING, SEE COMMENTS and REDNESS     With prolonged exposure   ? Levaquin [Levofloxacin] SEE COMMENTS     Body aches and shakes   ? Plavix [Clopidogrel] SEE COMMENTS     Joint pain and muscle pain   ? Sulfa (Sulfonamide Antibiotics) ITCHING       Objective:         ? acetaminophen (TYLENOL) 325 mg tablet Take two tablets by mouth every 6 hours as needed.   ? allopurinoL (ZYLOPRIM) 100 mg tablet Take two tablets by mouth at bedtime daily. Take with food.   ? allopurinoL (ZYLOPRIM) 300 mg tablet Take one tablet by mouth daily. Take with food.   ? aspirin EC 81 mg tablet Take one tablet by mouth daily. Take with food.   ? coQ10 (ubiquinol) 100 mg cap Take one capsule by mouth at bedtime daily.   ? cyclobenzaprine (FLEXERIL) 5 mg tablet Take one tablet by mouth at bedtime as needed for Muscle Cramps.   ? fexofenadine (ALLEGRA) 180 mg tablet Take one tablet by mouth daily.   ? glucosamine-D3-Boswellia serr 1,500-400-100 mg-unit-mg tab Take 1 tablet by mouth daily.   ? losartan (COZAAR) 50 mg tablet Take one tablet by mouth daily.   ? MEN'S MULTI-VITAMIN PO Take 1 tablet  by mouth daily.   ? metoprolol tartrate 25 mg tablet Take one-half tablet by mouth twice daily.   ? nitroglycerin (NITROSTAT) 0.4 mg tablet Place one tablet under tongue every 5 minutes as needed for Chest Pain. Max of 3 tablets, call 911.   ? rosuvastatin (CRESTOR) 20 mg tablet Take one tablet by mouth at bedtime daily.   ? senna/docusate (SENOKOT-S) 8.6/50 mg tablet Take two tablets by mouth twice daily. (Patient taking differently: Take two tablets by mouth as Needed.)   ? sodium chloride (SEA MIST) 0.65 % nasal spray Apply two sprays to each nostril as directed as Needed.   ? sour cherry extract (TART CHERRY EXTRACT) 1,000 mg cap Take one capsule by mouth daily.   ? ticagrelor (BRILINTA) 90 mg tablet Take one tablet by mouth twice daily.   ? triamcinolone (NASACORT) 55 mcg nasal inhaler Apply two sprays to each nostril as directed daily.     Vitals:    09/30/21 1119   BP: (!) 153/81  Comment: denies CP,HA,dizziness, vision changes   Pulse: 55   Temp: 36.9 ?C (98.4 ?F)   PainSc: Five   Weight: 86.2 kg (190 lb)   Height: 170.2 cm (5' 7)     Body mass index is 29.76 kg/m?Marland Kitchen       Physical Exam    General: WG/WN, ASA, cooperative demeanor, appropriately follows commands  HEENT: NC/AT  Cardiac: RR with bradycardia   Neck: supple, full ROM, soft right carotid bruit, none on left   ?  Speech: fluent, no dysarthria or aphasia  Mental status: Alert, oriented x 4, NAD  ?  CN: PERRL, EOMI without restriction or nystagmus, facial sensation symmetric (V1-V3) to LT bilaterally, No facial droop or ptosis, palatal rise symmetric,  shoulder shrug symmetric, tongue midline  ?  Strength:   UEs: 5/5 SA/EF/EE/WF/WE   LEs:  5/5 HF/KE/DF/PF  No involuntary movements, no tremor?  Sensation: LT symmetric in all limbs, no dermatomal deficit  DTRs: 2/2 in BR/B 1/1P/A, downgoing plantar reflexes bilaterally  Gait: normal primary base and station, no ataxia       Assessment and Plan:    1.  History of stroke x2 (2014/2022) without residual  2.  Coronary artery disease s/p CABG x 19 May 2021  3.  Non-ST elevation myocardial infarction  4.  Obstructive sleep apnea  5.  Small stable right MCA bifurcation aneurysm  6.  Hyperlipidemia-on statin  7.  Chronic left carotid occlusion  8.  Asymptomatic right carotid artery stenosis  9.  Essential hypertension    Plan:  1.  Continue aspirin 81 mg daily indefinitely (CVA/MI prevention)  2.  Agree with 1 year treatment Brilinta 90 mg twice daily per cardiology  3.  Stroke risk factor management.  Recommend blood pressure 120-130/70-80  4.  Recommended LDL less than 70.  Recommended A1c less than 6.5.  He is a non-smoker.  5.  Weight management and exercise advised.  Has completed cardiac rehab.  6. No intervention to small aneurysm.  Clinical surveillance recommended.  7.  Conservative management of carotid stenosis.  Yearly carotid ultrasound/CTA advised  8.  Discharge from neurology.  Follow-up with PCP and cardiology who can monitor carotids and aneurysm.  RTC with me as needed if neurologic change or concern.

## 2021-10-16 ENCOUNTER — Encounter: Admit: 2021-10-16 | Discharge: 2021-10-16 | Payer: MEDICARE

## 2021-10-16 ENCOUNTER — Ambulatory Visit: Admit: 2021-10-16 | Discharge: 2021-10-16 | Payer: MEDICARE

## 2021-10-16 DIAGNOSIS — M461 Sacroiliitis, not elsewhere classified: Secondary | ICD-10-CM

## 2021-10-16 DIAGNOSIS — M5030 Other cervical disc degeneration, unspecified cervical region: Secondary | ICD-10-CM

## 2021-10-16 DIAGNOSIS — I219 Acute myocardial infarction, unspecified: Secondary | ICD-10-CM

## 2021-10-16 DIAGNOSIS — I729 Aneurysm of unspecified site: Secondary | ICD-10-CM

## 2021-10-16 DIAGNOSIS — I639 Cerebral infarction, unspecified: Secondary | ICD-10-CM

## 2021-10-16 DIAGNOSIS — K429 Umbilical hernia without obstruction or gangrene: Secondary | ICD-10-CM

## 2021-10-16 DIAGNOSIS — Z9229 Personal history of other drug therapy: Secondary | ICD-10-CM

## 2021-10-16 DIAGNOSIS — I779 Disorder of arteries and arterioles, unspecified: Secondary | ICD-10-CM

## 2021-10-16 DIAGNOSIS — I2510 Atherosclerotic heart disease of native coronary artery without angina pectoris: Secondary | ICD-10-CM

## 2021-10-16 DIAGNOSIS — G629 Polyneuropathy, unspecified: Secondary | ICD-10-CM

## 2021-10-16 DIAGNOSIS — K5732 Diverticulitis of large intestine without perforation or abscess without bleeding: Secondary | ICD-10-CM

## 2021-10-16 DIAGNOSIS — I10 Essential (primary) hypertension: Secondary | ICD-10-CM

## 2021-10-16 DIAGNOSIS — M1990 Unspecified osteoarthritis, unspecified site: Secondary | ICD-10-CM

## 2021-10-16 DIAGNOSIS — M5136 Other intervertebral disc degeneration, lumbar region: Secondary | ICD-10-CM

## 2021-10-16 DIAGNOSIS — G4733 Obstructive sleep apnea (adult) (pediatric): Secondary | ICD-10-CM

## 2021-10-16 DIAGNOSIS — I671 Cerebral aneurysm, nonruptured: Secondary | ICD-10-CM

## 2021-10-16 DIAGNOSIS — N200 Calculus of kidney: Secondary | ICD-10-CM

## 2021-10-16 DIAGNOSIS — M109 Gout, unspecified: Secondary | ICD-10-CM

## 2021-10-16 DIAGNOSIS — I7789 Other specified disorders of arteries and arterioles: Secondary | ICD-10-CM

## 2021-10-16 DIAGNOSIS — M5134 Other intervertebral disc degeneration, thoracic region: Secondary | ICD-10-CM

## 2021-10-16 DIAGNOSIS — M4800 Spinal stenosis, site unspecified: Secondary | ICD-10-CM

## 2021-10-16 DIAGNOSIS — J302 Other seasonal allergic rhinitis: Secondary | ICD-10-CM

## 2021-10-16 DIAGNOSIS — E7800 Pure hypercholesterolemia, unspecified: Secondary | ICD-10-CM

## 2021-10-16 DIAGNOSIS — M2550 Pain in unspecified joint: Secondary | ICD-10-CM

## 2021-10-16 DIAGNOSIS — K5792 Diverticulitis of intestine, part unspecified, without perforation or abscess without bleeding: Secondary | ICD-10-CM

## 2021-10-16 NOTE — Progress Notes
SPINE CENTER CLINIC NOTE       SUBJECTIVE: 66 year old male presenting in follow-up from last visit on 09/04/2021.  At that time he was diagnosed with bilateral sacroiliitis, potentially some lumbar stenosis.  He has been working with formal physical therapy and is making some progress but he still has significant pain in his sacroiliac joints bilaterally.  This is worse when he is getting out of bed in the morning, sitting in 1 position for too long and trying to get up and move around.  He is unable to take NSAIDs due to his cardiac conditions.  He is interested in neck step of therapy.         Review of Systems    Current Outpatient Medications:   ?  acetaminophen (TYLENOL) 325 mg tablet, Take two tablets by mouth every 6 hours as needed., Disp: , Rfl: 0  ?  allopurinoL (ZYLOPRIM) 100 mg tablet, Take two tablets by mouth at bedtime daily. Take with food., Disp: , Rfl:   ?  allopurinoL (ZYLOPRIM) 300 mg tablet, Take one tablet by mouth daily. Take with food., Disp: , Rfl:   ?  aspirin EC 81 mg tablet, Take one tablet by mouth daily. Take with food., Disp: , Rfl:   ?  coQ10 (ubiquinol) 100 mg cap, Take one capsule by mouth at bedtime daily., Disp: , Rfl:   ?  cyclobenzaprine (FLEXERIL) 5 mg tablet, Take one tablet by mouth at bedtime as needed for Muscle Cramps., Disp: 90 tablet, Rfl: 0  ?  fexofenadine (ALLEGRA) 180 mg tablet, Take one tablet by mouth daily., Disp: , Rfl:   ?  glucosamine-D3-Boswellia serr 1,500-400-100 mg-unit-mg tab, Take 1 tablet by mouth daily., Disp: , Rfl:   ?  losartan (COZAAR) 50 mg tablet, Take one tablet by mouth daily., Disp: 90 tablet, Rfl: 1  ?  MEN'S MULTI-VITAMIN PO, Take 1 tablet by mouth daily., Disp: , Rfl:   ?  metoprolol tartrate 25 mg tablet, Take one-half tablet by mouth twice daily., Disp: 60 tablet, Rfl: 3  ?  nitroglycerin (NITROSTAT) 0.4 mg tablet, Place one tablet under tongue every 5 minutes as needed for Chest Pain. Max of 3 tablets, call 911., Disp: , Rfl:   ? rosuvastatin (CRESTOR) 20 mg tablet, Take one tablet by mouth at bedtime daily., Disp: , Rfl:   ?  senna/docusate (SENOKOT-S) 8.6/50 mg tablet, Take two tablets by mouth twice daily. (Patient taking differently: Take two tablets by mouth as Needed.), Disp: 90 tablet, Rfl: 0  ?  sodium chloride (SEA MIST) 0.65 % nasal spray, Apply two sprays to each nostril as directed as Needed., Disp: , Rfl:   ?  sour cherry extract (TART CHERRY EXTRACT) 1,000 mg cap, Take one capsule by mouth daily., Disp: , Rfl:   ?  ticagrelor (BRILINTA) 90 mg tablet, Take one tablet by mouth twice daily., Disp: 60 tablet, Rfl: 3  ?  triamcinolone (NASACORT) 55 mcg nasal inhaler, Apply two sprays to each nostril as directed daily., Disp: , Rfl:   Allergies   Allergen Reactions   ? Doxycycline MUSCLE PAIN   ? Colchicine DIARRHEA and NAUSEA ONLY     Body aches, diarrhea, nausea   ? Latex ITCHING, SEE COMMENTS and REDNESS     With prolonged exposure   ? Levaquin [Levofloxacin] SEE COMMENTS     Body aches and shakes   ? Plavix [Clopidogrel] SEE COMMENTS     Joint pain and muscle pain   ? Sulfa (Sulfonamide  Antibiotics) ITCHING     Physical Exam  Vitals:    10/16/21 1438   PainSc: Four     Oswestry Total Score:: 30  Pain Score: Four  There is no height or weight on file to calculate BMI.  Physical Exam ?  Gen: comfortable, NAD ?  HEENT: NCAT, anicteric sclera ?  Card: Extremities warm, well-perfused, cap refill <2sec ?  Pulm: no distress, not cyanotic ?  Abd: soft, non-distended ?  Skin: Skin is warm and dry.  Psychiatric: normal mood and affect. Behavior is normal.     Neuro ?  CNII-XII grossly normal ?  Mental status appropriate     MSK: ?  Inspection: grossly symmetric, no obvious deformity, no erythema ?  Palpation: Tender palpation bilateral SI joints  Maneuvers: Positive Patrick's, Gaenslen's, SI compression, Fortin fingers bilaterally             IMPRESSION:  1. Sacroiliitis (HCC)          PLAN: Scheduling bilateral sacroiliac joint injections for both diagnostic and therapeutic purposes.  Agree with continuing conservative measures including physical therapy, agree with aquatic therapy.

## 2021-11-12 ENCOUNTER — Encounter: Admit: 2021-11-12 | Discharge: 2021-11-12 | Payer: MEDICARE

## 2021-11-13 ENCOUNTER — Encounter: Admit: 2021-11-13 | Discharge: 2021-11-13 | Payer: MEDICARE

## 2021-11-13 ENCOUNTER — Ambulatory Visit: Admit: 2021-11-13 | Discharge: 2021-11-13 | Payer: MEDICARE

## 2021-11-13 DIAGNOSIS — M461 Sacroiliitis, not elsewhere classified: Secondary | ICD-10-CM

## 2021-11-13 MED ORDER — BUPIVACAINE (PF) 0.5 % (5 MG/ML) IJ SOLN
5 mL | Freq: Once | INTRAMUSCULAR | 0 refills | Status: CP
Start: 2021-11-13 — End: ?
  Administered 2021-11-13: 15:00:00 5 mL via INTRAMUSCULAR

## 2021-11-13 MED ORDER — TRIAMCINOLONE ACETONIDE 40 MG/ML IJ SUSP
80 mg | Freq: Once | EPIDURAL | 0 refills | Status: CP
Start: 2021-11-13 — End: ?
  Administered 2021-11-13: 15:00:00 80 mg via EPIDURAL

## 2021-11-13 MED ORDER — IOHEXOL 240 MG IODINE/ML IV SOLN
1 mL | Freq: Once | EPIDURAL | 0 refills | Status: CP
Start: 2021-11-13 — End: ?
  Administered 2021-11-13: 15:00:00 1 mL via EPIDURAL

## 2021-11-13 NOTE — Progress Notes

## 2021-11-13 NOTE — Procedures
Attending Surgeon: Lizbeth Bark, MD    Anesthesia: Local    Pre-Procedure Diagnosis:   1. Sacroiliitis (HCC)        Post-Procedure Diagnosis:   1. Sacroiliitis (HCC)        Pain Score: Four    Ronnie Williams AMB SPINE SI JOINT INJECT ANESTH/STEROID PROC  Location: sacroiliac joint Bilat sacroiliac joint    Consent:   Consent obtained: verbal and written  Consent given by: patient  Risks discussed: arrhythmia, skin discoloration, bleeding, soft tissue reaction, bradycardia, vomiting, damage to surrounding structures, headache, hyperglycemia, infection, itching, nerve damage and pain  Alternatives discussed: alternative treatment  Discussed with patient the purpose of the treatment/procedure, other ways of treating my condition, including no treatment/ procedure and the risks and benefits of the alternatives. Patient has decided to proceed with treatment/procedure.        Universal Protocol:  Relevant documents: relevant documents present and verified  Test results: test results available and properly labeled  Imaging studies: imaging studies available  Required items: required blood products, implants, devices, and special equipment available  Site marked: the operative site was marked  Patient identity confirmed: Patient identify confirmed verbally with patient.        Time out: Immediately prior to procedure a time out was called to verify the correct patient, procedure, equipment, support staff and site/side marked as required      Procedures Details:   Indications: pain   Pre Procedure Pain Level - 7  At Least Three Positive findings with provacative maneuvers including - FABER, SI Compression Test, Gaenslen's Test and Yeoman's SI Test  Reason for Procedure - Therapeutic  Patient experiences moderate to severe low back pain primarily over the anatomical location of the SI joints between the upper level of the iliac crests and the gluteal fold below L5 without radiculopathy, duration of at least three (3) months, and pain persists despite a minimum of four weeks of conservative therapies.   Clinical findings and/or imaging studies do not suggest other diagnosed or obvious cause of the lumbosacral pain (such as central spinal stenosis with neurogenic claudication/myelopathy, foraminal stenosis or disc herniation with concordant radicular pain/radiculopathy, infection, tumor, fracture, pseudoarthrosis, or pain related to spinal instrumentation).   Prep: 2% chlorhexidine  Local anesthetic: subcutaneous lidocaine 1% 10mg /mL  Needle size: 22 G  Approach: posterior  Patient tolerance: Patient tolerated the procedure well with no immediate complications. Pressure was applied, and hemostasis was accomplished.  Comments: DESCRIPTION OF PROCEDURE:  The procedure risks and benefits were explained.  Informed consent was obtained from the patient.  The patient was placed in prone position on the fluoroscopy table.  Blood pressure cuff and oxygen saturation monitors were attached and the patient was monitored throughout the procedure.  The right SI joint was identified with the use of fluoroscopy in the AP view.  The skin was prepped using Chlorhexadine and draped in aseptic fashion.  C-arm was rotated obliquely towards the right side to line up the anterior and posterior margins of the SI joint.  Skin and subcutaneous tissue were anesthetized using 3 mL of 1 percent lidocaine with 25-gauge needle.  A 3-1/2-inch 22-gauge spinal needle was advanced parallel to the x-ray beam towards the lower third of the joint line.  The needle was advanced slowly until the tip of the needle made contact with the bone.  The needle was walked off from the bone to the joint space.  We subsequently injected 0.4 mL of contrast dye.  An arthrogram  was seen.  After negative aspiration, a solution containing 1 mL of 0.25 percent bupivacaine and 40 mg of triamcinolone was injected.  The stylet was re-inserted and needle was then removed.  There was no sensory or motor deficit in the extremity noted.     Attention was taken to the left side where the injection was performed in similar manner.    After the procedure, the patient's blood pressure, heart rate, oxygen saturation, and VAS were recorded in the chart.  There were no complications.  The patient tolerated the procedure well and was brought to the recovery room for observation in stable condition and discharged with written discharge instructions.     PLAN OF CARE:  The patient is to follow up in the interventional spine clinic in 3 weeks.     The patient was advised to contact the interventional spine center for any of the following:    Fever, chills, or night sweats.  New onset severe sharp pain.  Any new upper or lower extremity weakness or numbness.  Any questions regarding the procedure.     If unable to contact the interventional spine center, the patient was instructed to go to the local emergency room.               Administrations This Visit     bupivacaine PF (MARCAINE) 0.5 % injection 5 mL     Admin Date  11/13/2021 Action  Given Dose  5 mL Route  Injection Administered By  Lizbeth Bark, MD          iohexoL (OMNIPAQUE-240) 240 mg/mL injection 1 mL     Admin Date  11/13/2021 Action  Given Dose  1 mL Route  Epidural Administered By  Lizbeth Bark, MD          triamcinolone acetonide [facility]) injection 80 mg     Admin Date  11/13/2021 Action  Given Dose  80 mg Route  Epidural Administered By  Lizbeth Bark, MD              Estimated blood loss: none or minimal  Specimens: none  Patient tolerated the procedure well with no immediate complications. Pressure was applied, and hemostasis was accomplished.

## 2021-12-16 ENCOUNTER — Encounter: Admit: 2021-12-16 | Discharge: 2021-12-16 | Payer: MEDICARE

## 2021-12-16 DIAGNOSIS — G4733 Obstructive sleep apnea (adult) (pediatric): Secondary | ICD-10-CM

## 2021-12-16 DIAGNOSIS — Z789 Other specified health status: Secondary | ICD-10-CM

## 2021-12-19 ENCOUNTER — Ambulatory Visit: Admit: 2021-12-19 | Discharge: 2021-12-19 | Payer: MEDICARE

## 2021-12-19 DIAGNOSIS — Z789 Other specified health status: Secondary | ICD-10-CM

## 2021-12-19 DIAGNOSIS — G4733 Obstructive sleep apnea (adult) (pediatric): Secondary | ICD-10-CM

## 2022-01-26 ENCOUNTER — Encounter: Admit: 2022-01-26 | Discharge: 2022-01-26 | Payer: MEDICARE

## 2022-01-26 DIAGNOSIS — I6523 Occlusion and stenosis of bilateral carotid arteries: Secondary | ICD-10-CM

## 2022-01-30 ENCOUNTER — Encounter: Admit: 2022-01-30 | Discharge: 2022-01-30 | Payer: MEDICARE

## 2022-01-30 DIAGNOSIS — G4733 Obstructive sleep apnea (adult) (pediatric): Secondary | ICD-10-CM

## 2022-02-02 ENCOUNTER — Encounter: Admit: 2022-02-02 | Discharge: 2022-02-02 | Payer: MEDICARE

## 2022-02-11 ENCOUNTER — Ambulatory Visit: Admit: 2022-02-11 | Discharge: 2022-02-12 | Payer: MEDICARE

## 2022-02-11 ENCOUNTER — Encounter: Admit: 2022-02-11 | Discharge: 2022-02-11 | Payer: MEDICARE

## 2022-02-11 DIAGNOSIS — I779 Disorder of arteries and arterioles, unspecified: Secondary | ICD-10-CM

## 2022-02-11 DIAGNOSIS — E7800 Pure hypercholesterolemia, unspecified: Secondary | ICD-10-CM

## 2022-02-11 DIAGNOSIS — K5732 Diverticulitis of large intestine without perforation or abscess without bleeding: Secondary | ICD-10-CM

## 2022-02-11 DIAGNOSIS — K429 Umbilical hernia without obstruction or gangrene: Secondary | ICD-10-CM

## 2022-02-11 DIAGNOSIS — M4800 Spinal stenosis, site unspecified: Secondary | ICD-10-CM

## 2022-02-11 DIAGNOSIS — I10 Essential (primary) hypertension: Secondary | ICD-10-CM

## 2022-02-11 DIAGNOSIS — I671 Cerebral aneurysm, nonruptured: Secondary | ICD-10-CM

## 2022-02-11 DIAGNOSIS — I7789 Other specified disorders of arteries and arterioles: Secondary | ICD-10-CM

## 2022-02-11 DIAGNOSIS — N200 Calculus of kidney: Secondary | ICD-10-CM

## 2022-02-11 DIAGNOSIS — M5136 Other intervertebral disc degeneration, lumbar region: Secondary | ICD-10-CM

## 2022-02-11 DIAGNOSIS — K5792 Diverticulitis of intestine, part unspecified, without perforation or abscess without bleeding: Secondary | ICD-10-CM

## 2022-02-11 DIAGNOSIS — M5134 Other intervertebral disc degeneration, thoracic region: Secondary | ICD-10-CM

## 2022-02-11 DIAGNOSIS — M1990 Unspecified osteoarthritis, unspecified site: Secondary | ICD-10-CM

## 2022-02-11 DIAGNOSIS — J329 Chronic sinusitis, unspecified: Secondary | ICD-10-CM

## 2022-02-11 DIAGNOSIS — G4733 Obstructive sleep apnea (adult) (pediatric): Secondary | ICD-10-CM

## 2022-02-11 DIAGNOSIS — I2510 Atherosclerotic heart disease of native coronary artery without angina pectoris: Secondary | ICD-10-CM

## 2022-02-11 DIAGNOSIS — Z8669 Personal history of other diseases of the nervous system and sense organs: Secondary | ICD-10-CM

## 2022-02-11 DIAGNOSIS — I729 Aneurysm of unspecified site: Secondary | ICD-10-CM

## 2022-02-11 DIAGNOSIS — Z9229 Personal history of other drug therapy: Secondary | ICD-10-CM

## 2022-02-11 DIAGNOSIS — J302 Other seasonal allergic rhinitis: Secondary | ICD-10-CM

## 2022-02-11 DIAGNOSIS — M5030 Other cervical disc degeneration, unspecified cervical region: Secondary | ICD-10-CM

## 2022-02-11 DIAGNOSIS — M2550 Pain in unspecified joint: Secondary | ICD-10-CM

## 2022-02-11 DIAGNOSIS — G629 Polyneuropathy, unspecified: Secondary | ICD-10-CM

## 2022-02-11 DIAGNOSIS — I639 Cerebral infarction, unspecified: Secondary | ICD-10-CM

## 2022-02-11 DIAGNOSIS — M109 Gout, unspecified: Secondary | ICD-10-CM

## 2022-02-11 DIAGNOSIS — I219 Acute myocardial infarction, unspecified: Secondary | ICD-10-CM

## 2022-02-11 DIAGNOSIS — G4719 Other hypersomnia: Secondary | ICD-10-CM

## 2022-02-11 DIAGNOSIS — Z789 Other specified health status: Secondary | ICD-10-CM

## 2022-02-11 NOTE — Telephone Encounter
An encounter has been created for documentation only (often for preparation of an upcoming appointment or for follow up on orders/imaging or records received) and patient does not need contact RN and did not miss a phone call or appointment.     Pre-visit planning for upcoming appointment with Dr.Luthra    NEW PT     -[facility] eval    -Referred By: Jackie Plum    -DX: OSA (obstructive sleep apnea)    -Last SS: 02/01/21

## 2022-02-12 DIAGNOSIS — G4733 Obstructive sleep apnea (adult) (pediatric): Secondary | ICD-10-CM

## 2022-02-13 ENCOUNTER — Encounter: Admit: 2022-02-13 | Discharge: 2022-02-13 | Payer: MEDICARE

## 2022-02-13 NOTE — Telephone Encounter
-----   Message from Dorina Hoyer sent at 02/12/2022 11:23 AM CDT -----  Regarding: lab  pt seeing [facility] 10/10. due for lipid, bmp, alt. could somebody follow up w/ pt to have drawn prior to ov? thank you!

## 2022-02-16 ENCOUNTER — Encounter: Admit: 2022-02-16 | Discharge: 2022-02-16 | Payer: MEDICARE

## 2022-02-16 DIAGNOSIS — M25551 Pain in right hip: Secondary | ICD-10-CM

## 2022-02-16 DIAGNOSIS — Z951 Presence of aortocoronary bypass graft: Secondary | ICD-10-CM

## 2022-02-16 DIAGNOSIS — E7849 Other hyperlipidemia: Secondary | ICD-10-CM

## 2022-02-16 DIAGNOSIS — M545 Low back pain, unspecified back pain laterality, unspecified chronicity, unspecified whether sciatica present: Secondary | ICD-10-CM

## 2022-02-16 DIAGNOSIS — I25118 Atherosclerotic heart disease of native coronary artery with other forms of angina pectoris: Secondary | ICD-10-CM

## 2022-02-16 DIAGNOSIS — I10 Essential (primary) hypertension: Secondary | ICD-10-CM

## 2022-02-16 LAB — LIPID PROFILE
CHOLESTEROL: 120
HDL: 48
LDL: 57
TRIGLYCERIDES: 78

## 2022-02-16 LAB — BASIC METABOLIC PANEL
BLD UREA NITROGEN: 19
CALCIUM: 8.7 — ABNORMAL LOW (ref 8.8–10.0)
CHLORIDE: 110 — ABNORMAL HIGH (ref 98–107)
CO2: 23
CREATININE: 0.9
GFR ESTIMATED: 81
GLUCOSE,PANEL: 98
POTASSIUM: 4
SODIUM: 141

## 2022-02-16 LAB — ALT (SGPT): ALT: 14 g/dL (ref 3.5–5.0)

## 2022-02-24 ENCOUNTER — Encounter: Admit: 2022-02-24 | Discharge: 2022-02-24 | Payer: MEDICARE

## 2022-02-24 DIAGNOSIS — M109 Gout, unspecified: Secondary | ICD-10-CM

## 2022-02-24 DIAGNOSIS — N200 Calculus of kidney: Secondary | ICD-10-CM

## 2022-02-24 DIAGNOSIS — I7789 Other specified disorders of arteries and arterioles: Secondary | ICD-10-CM

## 2022-02-24 DIAGNOSIS — I639 Cerebral infarction, unspecified: Secondary | ICD-10-CM

## 2022-02-24 DIAGNOSIS — M5134 Other intervertebral disc degeneration, thoracic region: Secondary | ICD-10-CM

## 2022-02-24 DIAGNOSIS — M2550 Pain in unspecified joint: Secondary | ICD-10-CM

## 2022-02-24 DIAGNOSIS — M4800 Spinal stenosis, site unspecified: Secondary | ICD-10-CM

## 2022-02-24 DIAGNOSIS — I671 Cerebral aneurysm, nonruptured: Secondary | ICD-10-CM

## 2022-02-24 DIAGNOSIS — G629 Polyneuropathy, unspecified: Secondary | ICD-10-CM

## 2022-02-24 DIAGNOSIS — I729 Aneurysm of unspecified site: Secondary | ICD-10-CM

## 2022-02-24 DIAGNOSIS — Z136 Encounter for screening for cardiovascular disorders: Secondary | ICD-10-CM

## 2022-02-24 DIAGNOSIS — I10 Essential (primary) hypertension: Secondary | ICD-10-CM

## 2022-02-24 DIAGNOSIS — M1990 Unspecified osteoarthritis, unspecified site: Secondary | ICD-10-CM

## 2022-02-24 DIAGNOSIS — I779 Disorder of arteries and arterioles, unspecified: Secondary | ICD-10-CM

## 2022-02-24 DIAGNOSIS — K5792 Diverticulitis of intestine, part unspecified, without perforation or abscess without bleeding: Secondary | ICD-10-CM

## 2022-02-24 DIAGNOSIS — I2510 Atherosclerotic heart disease of native coronary artery without angina pectoris: Secondary | ICD-10-CM

## 2022-02-24 DIAGNOSIS — K5732 Diverticulitis of large intestine without perforation or abscess without bleeding: Secondary | ICD-10-CM

## 2022-02-24 DIAGNOSIS — J329 Chronic sinusitis, unspecified: Secondary | ICD-10-CM

## 2022-02-24 DIAGNOSIS — M5136 Other intervertebral disc degeneration, lumbar region: Secondary | ICD-10-CM

## 2022-02-24 DIAGNOSIS — I219 Acute myocardial infarction, unspecified: Secondary | ICD-10-CM

## 2022-02-24 DIAGNOSIS — M5030 Other cervical disc degeneration, unspecified cervical region: Secondary | ICD-10-CM

## 2022-02-24 DIAGNOSIS — Z9229 Personal history of other drug therapy: Secondary | ICD-10-CM

## 2022-02-24 DIAGNOSIS — G4733 Obstructive sleep apnea (adult) (pediatric): Secondary | ICD-10-CM

## 2022-02-24 DIAGNOSIS — E7800 Pure hypercholesterolemia, unspecified: Secondary | ICD-10-CM

## 2022-02-24 DIAGNOSIS — J302 Other seasonal allergic rhinitis: Secondary | ICD-10-CM

## 2022-02-24 DIAGNOSIS — K429 Umbilical hernia without obstruction or gangrene: Secondary | ICD-10-CM

## 2022-02-24 MED ORDER — LOSARTAN 50 MG PO TAB
50 mg | ORAL_TABLET | Freq: Every day | ORAL | 3 refills | 90.00000 days | Status: AC
Start: 2022-02-24 — End: ?

## 2022-03-03 ENCOUNTER — Encounter: Admit: 2022-03-03 | Discharge: 2022-03-03 | Payer: MEDICARE

## 2022-03-04 ENCOUNTER — Ambulatory Visit: Admit: 2022-03-04 | Discharge: 2022-03-04 | Payer: MEDICARE

## 2022-03-04 ENCOUNTER — Encounter: Admit: 2022-03-04 | Discharge: 2022-03-04 | Payer: MEDICARE

## 2022-03-04 DIAGNOSIS — Z9229 Personal history of other drug therapy: Secondary | ICD-10-CM

## 2022-03-04 DIAGNOSIS — N200 Calculus of kidney: Secondary | ICD-10-CM

## 2022-03-04 DIAGNOSIS — I729 Aneurysm of unspecified site: Secondary | ICD-10-CM

## 2022-03-04 DIAGNOSIS — K5732 Diverticulitis of large intestine without perforation or abscess without bleeding: Secondary | ICD-10-CM

## 2022-03-04 DIAGNOSIS — G4733 Obstructive sleep apnea (adult) (pediatric): Secondary | ICD-10-CM

## 2022-03-04 DIAGNOSIS — I639 Cerebral infarction, unspecified: Secondary | ICD-10-CM

## 2022-03-04 DIAGNOSIS — I2510 Atherosclerotic heart disease of native coronary artery without angina pectoris: Secondary | ICD-10-CM

## 2022-03-04 DIAGNOSIS — J302 Other seasonal allergic rhinitis: Secondary | ICD-10-CM

## 2022-03-04 DIAGNOSIS — I779 Disorder of arteries and arterioles, unspecified: Secondary | ICD-10-CM

## 2022-03-04 DIAGNOSIS — M109 Gout, unspecified: Secondary | ICD-10-CM

## 2022-03-04 DIAGNOSIS — I10 Essential (primary) hypertension: Secondary | ICD-10-CM

## 2022-03-04 DIAGNOSIS — G629 Polyneuropathy, unspecified: Secondary | ICD-10-CM

## 2022-03-04 DIAGNOSIS — E7800 Pure hypercholesterolemia, unspecified: Secondary | ICD-10-CM

## 2022-03-04 DIAGNOSIS — M4800 Spinal stenosis, site unspecified: Secondary | ICD-10-CM

## 2022-03-04 DIAGNOSIS — M5134 Other intervertebral disc degeneration, thoracic region: Secondary | ICD-10-CM

## 2022-03-04 DIAGNOSIS — K5792 Diverticulitis of intestine, part unspecified, without perforation or abscess without bleeding: Secondary | ICD-10-CM

## 2022-03-04 DIAGNOSIS — Z8669 Personal history of other diseases of the nervous system and sense organs: Secondary | ICD-10-CM

## 2022-03-04 DIAGNOSIS — M1990 Unspecified osteoarthritis, unspecified site: Secondary | ICD-10-CM

## 2022-03-04 DIAGNOSIS — M5136 Other intervertebral disc degeneration, lumbar region: Secondary | ICD-10-CM

## 2022-03-04 DIAGNOSIS — M2550 Pain in unspecified joint: Secondary | ICD-10-CM

## 2022-03-04 DIAGNOSIS — I7789 Other specified disorders of arteries and arterioles: Secondary | ICD-10-CM

## 2022-03-04 DIAGNOSIS — J329 Chronic sinusitis, unspecified: Secondary | ICD-10-CM

## 2022-03-04 DIAGNOSIS — M5030 Other cervical disc degeneration, unspecified cervical region: Secondary | ICD-10-CM

## 2022-03-04 DIAGNOSIS — I219 Acute myocardial infarction, unspecified: Secondary | ICD-10-CM

## 2022-03-04 DIAGNOSIS — I671 Cerebral aneurysm, nonruptured: Secondary | ICD-10-CM

## 2022-03-04 DIAGNOSIS — K429 Umbilical hernia without obstruction or gangrene: Secondary | ICD-10-CM

## 2022-03-04 DIAGNOSIS — Z789 Other specified health status: Secondary | ICD-10-CM

## 2022-03-05 ENCOUNTER — Ambulatory Visit: Admit: 2022-03-05 | Discharge: 2022-03-04 | Payer: MEDICARE

## 2022-03-06 ENCOUNTER — Encounter: Admit: 2022-03-06 | Discharge: 2022-03-06 | Payer: MEDICARE

## 2022-03-06 DIAGNOSIS — G479 Sleep disorder, unspecified: Secondary | ICD-10-CM

## 2022-03-13 ENCOUNTER — Encounter: Admit: 2022-03-13 | Discharge: 2022-03-13 | Payer: MEDICARE

## 2022-03-13 ENCOUNTER — Ambulatory Visit: Admit: 2022-03-13 | Discharge: 2022-03-13 | Payer: MEDICARE

## 2022-03-13 DIAGNOSIS — J329 Chronic sinusitis, unspecified: Secondary | ICD-10-CM

## 2022-03-13 DIAGNOSIS — I10 Essential (primary) hypertension: Secondary | ICD-10-CM

## 2022-03-13 DIAGNOSIS — M4800 Spinal stenosis, site unspecified: Secondary | ICD-10-CM

## 2022-03-13 DIAGNOSIS — I729 Aneurysm of unspecified site: Secondary | ICD-10-CM

## 2022-03-13 DIAGNOSIS — M5134 Other intervertebral disc degeneration, thoracic region: Secondary | ICD-10-CM

## 2022-03-13 DIAGNOSIS — M5030 Other cervical disc degeneration, unspecified cervical region: Secondary | ICD-10-CM

## 2022-03-13 DIAGNOSIS — I671 Cerebral aneurysm, nonruptured: Secondary | ICD-10-CM

## 2022-03-13 DIAGNOSIS — I779 Disorder of arteries and arterioles, unspecified: Secondary | ICD-10-CM

## 2022-03-13 DIAGNOSIS — K429 Umbilical hernia without obstruction or gangrene: Secondary | ICD-10-CM

## 2022-03-13 DIAGNOSIS — M1990 Unspecified osteoarthritis, unspecified site: Secondary | ICD-10-CM

## 2022-03-13 DIAGNOSIS — M5136 Other intervertebral disc degeneration, lumbar region: Secondary | ICD-10-CM

## 2022-03-13 DIAGNOSIS — G4733 Obstructive sleep apnea (adult) (pediatric): Secondary | ICD-10-CM

## 2022-03-13 DIAGNOSIS — I7789 Other specified disorders of arteries and arterioles: Secondary | ICD-10-CM

## 2022-03-13 DIAGNOSIS — M2550 Pain in unspecified joint: Secondary | ICD-10-CM

## 2022-03-13 DIAGNOSIS — E785 Hyperlipidemia, unspecified: Secondary | ICD-10-CM

## 2022-03-13 DIAGNOSIS — E7800 Pure hypercholesterolemia, unspecified: Secondary | ICD-10-CM

## 2022-03-13 DIAGNOSIS — I639 Cerebral infarction, unspecified: Secondary | ICD-10-CM

## 2022-03-13 DIAGNOSIS — K5732 Diverticulitis of large intestine without perforation or abscess without bleeding: Secondary | ICD-10-CM

## 2022-03-13 DIAGNOSIS — I219 Acute myocardial infarction, unspecified: Secondary | ICD-10-CM

## 2022-03-13 DIAGNOSIS — I2510 Atherosclerotic heart disease of native coronary artery without angina pectoris: Secondary | ICD-10-CM

## 2022-03-13 DIAGNOSIS — I6523 Occlusion and stenosis of bilateral carotid arteries: Secondary | ICD-10-CM

## 2022-03-13 DIAGNOSIS — Z9229 Personal history of other drug therapy: Secondary | ICD-10-CM

## 2022-03-13 DIAGNOSIS — M109 Gout, unspecified: Secondary | ICD-10-CM

## 2022-03-13 DIAGNOSIS — K5792 Diverticulitis of intestine, part unspecified, without perforation or abscess without bleeding: Secondary | ICD-10-CM

## 2022-03-13 DIAGNOSIS — J302 Other seasonal allergic rhinitis: Secondary | ICD-10-CM

## 2022-03-13 DIAGNOSIS — G629 Polyneuropathy, unspecified: Secondary | ICD-10-CM

## 2022-03-13 DIAGNOSIS — N200 Calculus of kidney: Secondary | ICD-10-CM

## 2022-03-13 DIAGNOSIS — I6522 Occlusion and stenosis of left carotid artery: Secondary | ICD-10-CM

## 2022-03-13 NOTE — Patient Instructions
Continue Aspirin and Crestor as prescribed.      What Is a TIA?  A TIA (transient ischemic attack) is an early warning that a stroke (also called a brain attack) is coming. A TIA is a temporary stroke. It causes no lasting damage. But the effects of a stroke, if it happens, can be very serious and lasting. If you think you are having symptoms of a TIA or stroke--even if they don?t last--get medical help right away.    Symptoms of TIA and stroke  Symptoms may come on suddenly and last for a few seconds or a few hours. You may have symptoms only once. Or they may come and go for days. If you notice any of the following symptoms, don?t wait. Call 911 or emergency services right away.  Weakness, numbness, tingling, or loss of feeling in your face, arm, or leg  Trouble seeing in one or both eyes; double vision  Slurred speech, trouble talking, or problems understanding others when they speak  Sudden, severe headache  Dizziness or a feeling of spinning  Loss of balance or falling  Blackouts  B.E. F.A.S.T.  Reminder for Stroke Signs     B.E.F.A.S.T. is an easy way to remember the warning signs of a stroke, and what to do if someone near you is experiencing them.       B.E. F.A.S.T. Stands for?  B  -  Balance  Is there a sudden difficulty with balance or coordination? Is walking or sitting difficult?  E  -  Eyes  Is there a sudden change in eyesight such as blurred vision, double vision, or a loss of vision in one or both eyes without pain?   F  -  Face   Does one side of the face droop or is it numb?  Ask the person to smile.  A  -  Arm   Is one arm weak or numb?  Ask the person to raise both arms.  Does one arm drift downward?  S  -  Speech   Is speech slurred or difficult to understand?  Are they unable to speak at all?  Ask the person to repeat is simple sentence like ?It is a bright and sunny day.?    T  -  Time to call 911!  If a person shows any of these symptoms, even if they go away, call 911 to get them to the hospital immediately.  Time is very important.  The sooner they get to the hospital, the more likely they are to receive stroke reversing and potentially life-saving treatment.        [employer] last reviewed this educational content on 05/18/2020  ? 2000-2019 [employer]anc[employer] Neas [address], [place], [place] [address]. All rights reserved. This information is not intended as a substitute for professional medical care. Always follow your healthcare professional's instructions. This information has been modified by your health care provider with permission from the publisher.        Carotid Artery Problems: Stroke  The carotid arteries are large blood vessels that carry blood to the brain. When these arteries are healthy, the brain gets all the oxygen and nutrients it needs to function well. But if the carotid arteries are damaged, this can greatly increase your risk of a stroke. A stroke is a sudden loss of brain function caused by a lack of blood flow and oxygen.    How artery damage can lead to a stroke  A healthy carotid artery is  smooth on the inside, like a tube. But health problems such as high blood pressure, diabetes, and smoking can damage the inside of the artery wall and make it rough. This lets fatty deposits called plaque build up on the artery wall. Blood clots called emboli may also form on the plaque. If pieces of plaque or emboli break off, they can flow in the blood and get stuck in a small blood vessel in the brain. This blocks the blood flow to a part of the brain, and causes a stroke.  Symptoms of a stroke  Below are common symptoms of a stroke.  Call 911 right away if you have any of these symptoms. Fast medical treatment for a stroke is vital. The longer you wait to get help, the more damage a stroke can do.  Sudden numbness or weakness of the face, arm, or leg, especially on one side of the body  Sudden confusion or trouble speaking or understanding other people  Sudden trouble seeing in 1 or both eyes  Sudden trouble walking, dizziness, or loss of balance or coordination  Sudden, severe headache with no known cause  Sudden new seizures  Transient ischemic attack (TIA)  A transient ischemic attack (TIA) is a mini-stroke. It?s a warning sign that a larger stroke may happen in the near future. A TIA is when an artery to the brain is blocked for a brief time. This will cause symptoms just like those that happen with a stroke. But with a TIA, they last a short period of time, from a few seconds to a few hours. Never ignore any TIA or stroke symptoms.  Call 911 right away.  B.E. F.A.S.T.  Reminder for Stroke Signs     B.E.F.A.S.T. is an easy way to remember the warning signs of a stroke, and what to do if someone near you is experiencing them.       B.E. F.A.S.T. Stands for?  B  -  Balance  Is there a sudden difficulty with balance or coordination? Is walking or sitting difficult?  E  -  Eyes  Is there a sudden change in eyesight such as blurred vision, double vision, or a loss of vision in one or both eyes without pain?   F  -  Face   Does one side of the face droop or is it numb?  Ask the person to smile.  A  -  Arm   Is one arm weak or numb?  Ask the person to raise both arms.  Does one arm drift downward?  S  -  Speech   Is speech slurred or difficult to understand?  Are they unable to speak at all?  Ask the person to repeat is simple sentence like ?It is a bright and sunny day.?    T  -  Time to call 911!  If a person shows any of these symptoms, even if they go away, call 911 to get them to the hospital immediately.  Time is very important.  The sooner they get to the hospital, the more likely they are to receive stroke reversing and potentially life-saving treatment.        StayWell last reviewed this educational content on 05/18/2020    ? 2000-2019 Nancie Neas [address] [place], [place] [address]. All rights reserved. This information is not intended as a substitute for professional medical care. Always follow your healthcare professional's instructions. This information has been modified by your health care provider with permission from  the publisher.

## 2022-03-13 NOTE — Telephone Encounter
Most recent ss:03/04/22

## 2022-03-13 NOTE — Progress Notes
Date of Service: 03/13/2022              Chief Complaint   Patient presents with   ? Follow Up     1 yr CAS       History of Present Illness    This is a very pleasant 66 year old gentleman with a past history of stroke in 2014 and again in February 2022.  He has a history of hypertension, hyperlipidemia, coronary artery disease and carotid stenosis.  He returns today for a bilateral carotid surveillance ultrasound and office visit.    He has less than 50% right carotid stenosis with a ratio of 1.2.  He has a chronically occluded left internal carotid artery.  He has antegrade flow listed in both of his vertebral arteries.  He has had no new lateralizing TIA or strokelike symptoms..      He denies current chest pain and shortness of breath.  He denies fever and chills.  He denies symptoms of claudication when he walks.    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  His most recent stroke on July 09, 2020 occurred in [place].  He reports that he had speech problems and right hand grasping problems with dropping items unexpectedly.  By the time he was evaluated at the hospital, too much time had elapsed for him to be able to have tPA.  Fortunately, he has had no significant residual deficits.  His prior stroke in 2014 was more obvious.  He reports his right arm went completely limp, he went immediately to the emergency room and did receive tPA.  At the time of his stroke in 2014, he had less than 50% stenosis bilaterally in his internal carotid arteries.  When he had his stroke in  February 2022, he was found to have a completely occluded left internal carotid artery.    He had a CTA of the head and neck  on August 21, 2020 which confirmed the occluded left internal carotid artery at the origin with retrograde flow into the distal ICA and with patent flow into the left ophthalmic artery.  He also had a minimal 2.5 mm aneurysm along the right MCA M1/M2 junction of the proximal M2 inferior division branch.    He had shoulder surgery shortly after I saw him last fall.  This was followed by chest pain resulting in coronary artery stenting in October 2022 and coronary artery bypass grafting in January 2023.  He has no pacemaker or defibrillator implanted.  He has no history of radiation or chemotherapy for any reason.    He is not diabetic.  He has no history of renal insufficiency.  He does not smoke.    He is currently on aspirin, Brilinta from his cardiologist and continues on rosuvastatin daily.          Medical History:   Diagnosis Date   ? Aneurysm (HCC)    ? Arthritis    ? Carotid artery disease (HCC)     CTO Left, and 50% blockage on Right carotid   ? Cerebral aneurysm    ? Coronary artery disease    ? Coronary atherosclerosis 02/2021   ? Degenerative disc disease, cervical 2000   ? Degenerative disc disease, lumbar 2000   ? Degenerative disc disease, thoracic 2000   ? Diverticulitis    ? Diverticulitis of colon (without mention of hemorrhage)(562.11) 2007   ? Gout    ?  Heart attack (HCC)    ? High cholesterol    ? HX: anticoagulation    ? Hypertension    ? Joint pain 2000   ? Kidney stones ???   ? Neuropathy    ? OSA on CPAP    ? Other specified disorders of arteries and arterioles (HCC)    ? Seasonal allergic reaction    ? Spinal stenosis ???   ? Stroke [facility]) 2014   ? Stroke [facility]) 06/2020   ? Umbilical hernia    ? Unspecified sinusitis (chronic) 12/31/1983       Surgical History:   Procedure Laterality Date   ? COLECTOMY  2007   ? HX EAR TUBES  02/25/2016   ? right shoulder arthroscopy, rotator cuff repair Right 02/28/2021    Performed by Dolan Amen, MD at [facility]   ? right shoulder arthroscopy,  distal clavicle excision Right 02/28/2021    Performed by Dolan Amen, MD at [facility]   ? right shoulder arthroscopy,  biceps tenotomy Right 02/28/2021    Performed by Dolan Amen, MD at [facility]   ? CORONARY STENT PLACEMENT 03/04/2021   ? Coronary artery bypass grafting x 2 , internal mammary artery harvest, endoscopic left saphenous vein harvest, implantable loop recorder removal. N/A 06/05/2021    Performed by Virginia Crews, MD at [facility]   ? HEART CATHETERIZATION  06/21/2021   ? COLONOSCOPY     ? TONSIL AND ADENOIDECTOMY         Allergies:  Allergies   Allergen Reactions   ? Doxycycline MUSCLE PAIN   ? Colchicine DIARRHEA and NAUSEA ONLY     Body aches, diarrhea, nausea   ? Latex ITCHING, SEE COMMENTS and REDNESS     With prolonged exposure   ? Levaquin [Levofloxacin] SEE COMMENTS     Body aches and shakes   ? Plavix [Clopidogrel] SEE COMMENTS     Joint pain and muscle pain   ? Sulfa (Sulfonamide Antibiotics) ITCHING       Medication List:  ? acetaminophen (TYLENOL) 325 mg tablet Take two tablets by mouth every 6 hours as needed.   ? allopurinoL (ZYLOPRIM) 100 mg tablet Take two tablets by mouth at bedtime daily. Take with food.   ? allopurinoL (ZYLOPRIM) 300 mg tablet Take one tablet by mouth daily. Take with food.   ? aspirin EC 81 mg tablet Take one tablet by mouth daily. Take with food.   ? coQ10 (ubiquinol) 100 mg cap Take one capsule by mouth at bedtime daily.   ? cyclobenzaprine (FLEXERIL) 5 mg tablet Take one tablet by mouth at bedtime as needed for Muscle Cramps.   ? fexofenadine (ALLEGRA) 180 mg tablet Take one tablet by mouth daily.   ? glucosamine-D3-Boswellia serr 1,500-400-100 mg-unit-mg tab Take 1 tablet by mouth daily.   ? losartan (COZAAR) 50 mg tablet Take one tablet by mouth daily.   ? MEN'S MULTI-VITAMIN PO Take 1 tablet by mouth daily.   ? nitroglycerin (NITROSTAT) 0.4 mg tablet Place one tablet under tongue every 5 minutes as needed for Chest Pain. Max of 3 tablets, call 911.   ? rosuvastatin (CRESTOR) 20 mg tablet Take one tablet by mouth at bedtime daily.   ? senna/docusate (SENOKOT-S) 8.6/50 mg tablet Take two tablets by mouth twice daily. (Patient taking differently: Take two tablets by mouth as Needed.) ? sodium chloride (SEA MIST) 0.65 % nasal spray Apply two sprays to each nostril as directed as  Needed.   ? sour cherry extract (TART CHERRY EXTRACT) 1,000 mg cap Take one capsule by mouth daily.   ? ticagrelor (BRILINTA) 90 mg tablet Take one tablet by mouth twice daily.   ? triamcinolone (NASACORT) 55 mcg nasal inhaler Apply two sprays to each nostril as directed daily.       Social History:   reports that he has never smoked. He has never used smokeless tobacco. He reports current alcohol use of about 2.0 standard drinks of alcohol per week. He reports that he does not use drugs.    Family History   Problem Relation Age of Onset   ? COPD Mother    ? Arthritis Mother    ? Back pain Mother    ? High Cholesterol Father    ? Heart Attack Brother    ? Asthma Brother    ? Coronary Artery Disease Brother    ? Heart Disease Brother         s/p PCI with stent placement   ? Coronary Artery Disease Brother    ? Heart Disease Maternal Grandfather    ? Diabetes Daughter    ? Crohn's Disease Daughter        Review of Systems            Vitals:    03/13/22 1248 03/13/22 1249   BP: (!) 163/88 (!) 171/98   BP Source: Arm, Left Upper Arm, Right Upper   Pulse: 64 55   Temp: 36.4 ?C (97.6 ?F)    TempSrc: Oral    PainSc: Zero    Weight: 87.9 kg (193 lb 12.8 oz)    Height: 170.2 cm (5' 7)      Body mass index is 30.35 kg/m?Marland Kitchen     Physical Exam  Vitals and nursing note reviewed. Exam conducted with a chaperone present.   Constitutional:       General: He is not in acute distress.     Appearance: Normal appearance. He is well-developed, well-groomed and normal weight. He is not ill-appearing or diaphoretic.   HENT:      Head: Normocephalic.   Eyes:      General:         Right eye: No discharge.         Left eye: No discharge.   Neck:      Vascular: No carotid bruit.      Trachea: Trachea and phonation normal.   Cardiovascular:      Rate and Rhythm: Normal rate and regular rhythm.      Pulses:           Carotid pulses are 2+ on the right side and 2+ on the left side.       Radial pulses are 2+ on the right side and 2+ on the left side.      Heart sounds: Normal heart sounds.   Pulmonary:      Effort: Pulmonary effort is normal. No respiratory distress.      Breath sounds: Normal breath sounds.   Musculoskeletal:      Cervical back: Normal range of motion and neck supple.   Skin:     General: Skin is warm and dry.      Capillary Refill: Capillary refill takes less than 2 seconds.      Coloration: Skin is not cyanotic or mottled.   Neurological:      General: No focal deficit present.      Mental Status: He is alert and  oriented to person, place, and time.      Motor: Motor function is intact.      Coordination: Coordination is intact.      Gait: Gait is intact.      Comments: His grip strength is strong and equal bilaterally.   Psychiatric:         Attention and Perception: Attention and perception normal.         Mood and Affect: Mood and affect normal.         Speech: Speech normal.         Behavior: Behavior normal. Behavior is cooperative.         Thought Content: Thought content normal.         Cognition and Memory: Cognition and memory normal.         Judgment: Judgment normal.             Assessment and Plan:    1. Left carotid artery occlusion        2. Bilateral carotid artery stenosis  VAS US DUPLEX SCAN CAROTID BILATERAL    VAS US DUPLEX SCAN CAROTID BILATERAL      3. Essential hypertension        4. Hyperlipidemia, unspecified hyperlipidemia type          He continues to have less than 50% right carotid stenosis and a chronically occluded left internal carotid artery.  He has had no new lateralizing TIA or strokelike symptoms.  I discussed with him his results and recommendations for follow-up.  We discussed that from a vascular surgery standpoint, low-dose aspirin is sufficient.  He needs to talk to his cardiologist with regard to whether or not to continue Brilinta.  We discussed good blood pressure control, good cholesterol control and continued statin therapy to help reduce atherosclerotic risk factors.    He may continue low-dose aspirin daily and may continue Brilinta as directed by cardiology.  Continue rosuvastatin daily.    We will follow-up again in 1 year with a bilateral carotid surveillance ultrasound and office visit at that time.    We did give him information on carotid disease for his visit summary.  He had no additional questions or concerns for me at this time.    Kathaleen Maser, APRN-NP    >   20    Minutes spent on chart review, physical exam and documentation.

## 2022-03-17 ENCOUNTER — Encounter: Admit: 2022-03-17 | Discharge: 2022-03-17 | Payer: MEDICARE

## 2022-03-18 ENCOUNTER — Encounter: Admit: 2022-03-18 | Discharge: 2022-03-18 | Payer: MEDICARE

## 2022-06-24 ENCOUNTER — Encounter: Admit: 2022-06-24 | Discharge: 2022-06-24 | Payer: MEDICARE

## 2022-09-10 ENCOUNTER — Encounter: Admit: 2022-09-10 | Discharge: 2022-09-10 | Payer: MEDICARE

## 2022-10-01 ENCOUNTER — Encounter: Admit: 2022-10-01 | Discharge: 2022-10-01 | Payer: MEDICARE

## 2022-10-01 ENCOUNTER — Ambulatory Visit: Admit: 2022-10-01 | Discharge: 2022-10-02 | Payer: MEDICARE

## 2022-10-01 DIAGNOSIS — M109 Gout, unspecified: Secondary | ICD-10-CM

## 2022-10-01 DIAGNOSIS — Z9229 Personal history of other drug therapy: Secondary | ICD-10-CM

## 2022-10-01 DIAGNOSIS — M5134 Other intervertebral disc degeneration, thoracic region: Secondary | ICD-10-CM

## 2022-10-01 DIAGNOSIS — M5136 Other intervertebral disc degeneration, lumbar region: Secondary | ICD-10-CM

## 2022-10-01 DIAGNOSIS — K5792 Diverticulitis of intestine, part unspecified, without perforation or abscess without bleeding: Secondary | ICD-10-CM

## 2022-10-01 DIAGNOSIS — J329 Chronic sinusitis, unspecified: Secondary | ICD-10-CM

## 2022-10-01 DIAGNOSIS — Z951 Presence of aortocoronary bypass graft: Secondary | ICD-10-CM

## 2022-10-01 DIAGNOSIS — M2550 Pain in unspecified joint: Secondary | ICD-10-CM

## 2022-10-01 DIAGNOSIS — I10 Essential (primary) hypertension: Secondary | ICD-10-CM

## 2022-10-01 DIAGNOSIS — M5030 Other cervical disc degeneration, unspecified cervical region: Secondary | ICD-10-CM

## 2022-10-01 DIAGNOSIS — M4800 Spinal stenosis, site unspecified: Secondary | ICD-10-CM

## 2022-10-01 DIAGNOSIS — K5732 Diverticulitis of large intestine without perforation or abscess without bleeding: Secondary | ICD-10-CM

## 2022-10-01 DIAGNOSIS — I729 Aneurysm of unspecified site: Secondary | ICD-10-CM

## 2022-10-01 DIAGNOSIS — I25118 Atherosclerotic heart disease of native coronary artery with other forms of angina pectoris: Secondary | ICD-10-CM

## 2022-10-01 DIAGNOSIS — I639 Cerebral infarction, unspecified: Secondary | ICD-10-CM

## 2022-10-01 DIAGNOSIS — J302 Other seasonal allergic rhinitis: Secondary | ICD-10-CM

## 2022-10-01 DIAGNOSIS — M1990 Unspecified osteoarthritis, unspecified site: Secondary | ICD-10-CM

## 2022-10-01 DIAGNOSIS — K429 Umbilical hernia without obstruction or gangrene: Secondary | ICD-10-CM

## 2022-10-01 DIAGNOSIS — G629 Polyneuropathy, unspecified: Secondary | ICD-10-CM

## 2022-10-01 DIAGNOSIS — G4733 Obstructive sleep apnea (adult) (pediatric): Secondary | ICD-10-CM

## 2022-10-01 DIAGNOSIS — N200 Calculus of kidney: Secondary | ICD-10-CM

## 2022-10-01 DIAGNOSIS — E7800 Pure hypercholesterolemia, unspecified: Secondary | ICD-10-CM

## 2022-10-01 DIAGNOSIS — I7789 Other specified disorders of arteries and arterioles: Secondary | ICD-10-CM

## 2022-10-01 DIAGNOSIS — I219 Acute myocardial infarction, unspecified: Secondary | ICD-10-CM

## 2022-10-01 DIAGNOSIS — I779 Disorder of arteries and arterioles, unspecified: Secondary | ICD-10-CM

## 2022-10-01 DIAGNOSIS — I2510 Atherosclerotic heart disease of native coronary artery without angina pectoris: Secondary | ICD-10-CM

## 2022-10-01 DIAGNOSIS — I671 Cerebral aneurysm, nonruptured: Secondary | ICD-10-CM

## 2022-10-01 DIAGNOSIS — I6522 Occlusion and stenosis of left carotid artery: Secondary | ICD-10-CM

## 2022-10-01 MED ORDER — LOSARTAN 50 MG PO TAB
50 mg | ORAL_TABLET | Freq: Every day | ORAL | 3 refills | 90.00000 days | Status: AC
Start: 2022-10-01 — End: ?

## 2022-10-01 NOTE — Patient Instructions
Continue current medication  Follow up in 1 year  Follow up as directed.  Call sooner if issues.  Call the [facility] nursing line at [phone].  Leave a detailed message for the nurse in [place] [facility] with how we can assist you and we will call you back.

## 2022-10-01 NOTE — Progress Notes
Date of Service: 10/01/2022    Ronnie Williams is a 67 y.o. male.       HPI   Ronnie Williams is presently doing well.  He reports that he underwent bilateral cataract surgery in March 2024 which was successful.  He very much enjoyed the cardiac rehab course that he went through after his myocardial infarction in October 2022 and actually continues going to cardiac rehab in [place] twice a week where he exercises for an hour each time.  He reports that his blood pressure is checked in cardiac rehab and is routinely less than 130 over 80 mmHg.  He also mows his grass for an hour each week without difficulty.  Otherwise, the patient has been doing well and reports no angina, congestive symptoms, palpitations, sensation of sustained forceful heart pounding, lightheadedness or syncope.  His exercise tolerance has been stable. The patient reports no claudication, bleeding abnormalities, or new strokelike symptoms.  He continues taking daily aspirin following prior myocardial infarction and coronary stenting.  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  However, he remains very active at home.     Historically, Ronnie Williams reports having a stroke in 2016 and received tPA with total recovery according to the patient.  He was placed on aspirin and Plavix at the time but indicates he did not tolerate Plavix because of muscle aches.  In January 2022 while on vacation in [place], he suffered another stroke which affected fine motor dexterity in his left hand.  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  Ronnie Williams indicates that he presented too late to receive tPA but fortunately had full recovery over time.  He was placed on statin therapy at that time.  The patient has also been followed for hypertension, dyslipidemia, OSA (intermittently on CPAP), and diverticulitis status post prior colon resection. On 02/28/2021 Ronnie Williams underwent right shoulder arthroscopy, rotator cuff repair and biceps tenotomy for treatment of a full-thickness rotator cuff tear, biceps tendinitis, and AC arthritis.  Ronnie Williams  experienced an acute myocardial infarction on 03/04/2021 with a troponin of 17.5.  He actually presented with back discomfort. He was transferred to [facility] hospital and underwent emergent stenting of the mid left circumflex coronary artery which was 99% obstructed.  Ostial disease of the left anterior descending coronary artery was noted and he subsequently underwent coronary bypass surgery on June 05, 2021 receiving a LIMA graft to the left anterior descending coronary artery and reverse saphenous vein graft to the diagonal vessel.  He experienced chest discomfort on June 19, 2021 and coronary angiography performed on June 21, 2021 showed a patent stent in the obtuse marginal as well as patent bypass grafts.  There was no evidence for an acute coronary syndrome and no intervention was required.        Vitals:    10/01/22 0828   BP: 135/83   BP Source: Arm, Left Upper   Pulse: 74   SpO2: 95%   O2 Device: None (Room air)   PainSc: Zero   Weight: 89.3 kg (196 lb 12.8 oz)   Height: 170.2 cm (5' 7)     Body mass index is 30.82 kg/m?Marland Kitchen     Past Medical History  Patient Active Problem List    Diagnosis Date Noted    Excessive daytime sleepiness 02/11/2022    Tooth abscess 06/20/2021  Unstable angina (HCC) 06/19/2021    Chest pain 06/19/2021    Hyponatremia 06/12/2021    Hypokalemia 06/10/2021    Thrombocytopenia (HCC) 06/06/2021    Acute blood loss anemia 06/06/2021    AKI (acute kidney injury) (HCC) 06/06/2021    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  This is the culprit lesion for the patient's current presentation. The ostium of the LAD has a 70% stenosis.  The mid LAD has 60-70% long segment of stenosis. The 2nd diagonal branch has 70-80% stenosis in the proximal segment. Successful PCI of the mid left circumflex artery with 4.0 x 15 mm Onyx Frontier DES. Measurement of RFR across the LAD was 0.63 suggesting that the stenosis is hemodynamically significant. Intravascular ultrasound of the left main coronary artery revealed minimal lumen area of 20.8 sq mm.  06/05/21: Coronary artery bypass grafting x 2 (LIMA to LAD, SVG to Diag) & implantable loop recorder removal.           Acute ST elevation myocardial infarction (STEMI) (HCC) 03/04/2021    ST elevation myocardial infarction (STEMI) [facility]) 03/04/2021     03/04/21- Coronary Angiogram- The mid left circumflex artery has a 99% stenosis.  This is the culprit lesion for the patient's current presentation.  The ostium of the left anterior descending artery has a 70% stenosis.  The mid left anterior descending artery has 60% to 70% long segment of stenosis.  The 2nd diagonal branch has 70% to 80% stenosis in the proximal segment.  Elevated left ventricular end-diastolic pressure.  No significant gradient across the aortic valve on pullback.  Successful percutaneous coronary intervention of the mid left circumflex artery with 4.0 x 15 mm Onyx Frontier drug-eluting stent postdilated with 4.5 NC.  Measurement of RFR across the left anterior descending artery was 0.63 suggesting that the stenosis is hemodynamically significant.  Intravascular ultrasound of the left main coronary artery revealed minimal lumen area of 20.8 sq mm.        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   HENT: Negative.     Eyes: Negative.    Cardiovascular: Negative.    Respiratory: Negative.     Endocrine: Negative.    Hematologic/Lymphatic:  Negative.    Skin: Negative.    Musculoskeletal: Negative.    Gastrointestinal: Negative.    Genitourinary: Negative.    Neurological: Negative.    Psychiatric/Behavioral: Negative.     Allergic/Immunologic: Negative.        Physical Exam  GENERAL: The patient is well developed, well nourished, resting comfortably and in no distress.   HEENT: No abnormalities of the visible oro-nasopharynx, conjunctiva or sclera are noted.  NECK: There is no jugular venous distension. Carotids are palpable and without bruits. There is no thyroid enlargement.  Chest: Lung fields are clear to auscultation. There are no wheezes or crackles.  CV: There is a regular rhythm. The first and second heart sounds are normal. There are no murmurs, gallops or rubs.  ABD: The abdomen is soft and supple with normal bowel sounds. There is no hepatosplenomegaly, ascites, tenderness, masses or bruits.  Neuro: There are no focal motor defects. Ambulation is normal. Cognitive function appears normal.  Ext: There is no edema or evidence of deep vein thrombosis. Peripheral pulses are satisfactory.  SKIN: There are no rashes and no cellulitis.  His sternal incision looks very good and his left leg endoscopic vein site harvest looks very good.  PSYCH: The patient is calm, rationale and oriented.     Cardiovascular Studies  A twelve-lead ECG obtained on June 19, 2021 reveals normal sinus rhythm with a heart rate of 70 bpm.  There is evidence of inferior infarct, age old or indeterminate with Q waves in the inferior leads.  A twelve-lead ECG obtained on 02/24/2022 reveals sinus bradycardia with a heart rate of 51 bpm.  Borderline left atrial enlargement is noted.  There is no evidence of myocardial ischemia.     Labs from 02/16/2022 revealed total cholesterol 120, triglycerides 78, HDL 48 and LDL cholesterol 57 mg/dL.  His ALT = 14.  His potassium was 4.0 mmol/L and his creatinine was 0.98 mg/dL.     Echo Doppler 06/20/2021:  Interpretation Summary     Slightly dilated left ventricle based on Biplane method-of-discs calculation.  Normal ejection fraction.  Visually, left ventricular ejection fraction=60%.  There is basal inferolateral and mid inferolateral hypokinesis.  There is abnormal septal motion probably due to postoperative state.  Normal left ventricular diastolic function and left atrial pressure.  Normal right ventricle size and probably normal function.  Mild biatrial enlargement.  Normal central venous pressure.  Mild mitral annular calcification with nonspecific thickening of the mitral valve.  No stenosis.  Unable to calculate systolic pulmonary artery pressure due to inadequate tricuspid valve regurgitant signal.  No pericardial effusion.     A direct visual comparison was performed with the most recent transthoracic echocardiogram from March 04, 2021.  There is new abnormal septal motion probably due to postoperative state compared to the prior study.  Otherwise, no other significant interval changes.  Echocardiographic Findings     Left Ventricle The left ventricle is mildly dilated. Mildly thickened basal septum with otherwise normal geometry. The left ventricular systolic function is normal. The visually estimated ejection fraction is 60%. The ejection fraction by Simpson's biplane method is 58%. Abnormal septal motion consistent with post-operative state. Normal left ventricular diastolic function. Normal left atrial pressure.   Right Ventricle The right ventricular size is normal. Quantitative parameters would suggest probably mildly reduced systolic function; however, based on visual estimation right ventricular systolic pressure is probably low normal.  See image 49. The pulmonary artery pressure could not be estimated due to inadequate tricuspid  regurgitation signal.   Left Atrium Mildly dilated.   Right Atrium Mildly dilated.   IVC/SVC Normal central venous pressure (0-5 mm Hg).   Mitral Valve Non-specific thickening. No stenosis. Trace regurgitation. There is mild mitral annular calcification.   Tricuspid Valve Normal valve structure. No stenosis. Trace regurgitation.   Aortic Valve Normal valve structure. Trileaflet valve. No stenosis. No regurgitation.   Pulmonary The pulmonic valve was not seen well but no Doppler evidence of stenosis. Trace regurgitation.   Aorta The aortic root and ascending aorta are normal in size.   Pericardium No pericardial effusion.       Cardiovascular Health Factors  Vitals BP Readings from Last 3 Encounters:   10/01/22 135/83   03/13/22 (!) 171/98   03/04/22 127/74     Wt Readings from Last 3 Encounters:   10/01/22 89.3 kg (196 lb 12.8 oz)   03/13/22 87.9 kg (193 lb 12.8 oz)   03/04/22 87.5 kg (193 lb)     BMI Readings from Last 3 Encounters:   10/01/22 30.82 kg/m?   03/13/22 30.35 kg/m?   03/04/22 30.23 kg/m?      Smoking Social History     Tobacco Use   Smoking Status Never   Smokeless Tobacco Never      Lipid Profile Cholesterol   Date Value Ref Range Status   02/16/2022 120  Final     HDL   Date Value Ref Range Status   02/16/2022 48  Final     LDL   Date Value Ref Range Status   02/16/2022 57  Final     Triglycerides   Date Value Ref Range Status   02/16/2022 78  Final      Blood Sugar Hemoglobin A1C   Date Value Ref Range Status   06/04/2021 6.1 (H) 4.0 - 6.0 % Final     Comment:     The ADA recommends that most patients with type 1 and type 2 diabetes maintain   an A1c level <7%.       Glucose   Date Value Ref Range Status   02/16/2022 98  Final   06/21/2021 102 (H) 70 - 100 MG/DL Final   16/02/9603 540 (H) 70 - 100 MG/DL Final     Glucose, POC   Date Value Ref Range Status   06/11/2021 117 (H) 70 - 100 MG/DL Final   98/03/9146 829 (H) 70 - 100 MG/DL Final   56/21/3086 578 (H) 70 - 100 MG/DL Final          Problems Addressed Today  Encounter Diagnoses   Name Primary?    S/P CABG x 2 Yes    Coronary artery disease of native artery of native heart with stable angina pectoris (HCC)     Occlusion of left carotid artery      Hypertension.  Hypercholesterolemia.  Assessment and Plan   Mr. Lovern appears to be doing very well from a cardiovascular perspective. He reports no angina or congestive symptoms and his blood pressure and lipid profile are very well controlled.  He continues participating in cardiac rehab twice a week where he exercises for an hour each time.  Cardiovascular risk modification was discussed at great detail. I have asked the patient to keep a log book of his BP readings and to report BP readings exceeding 130/80 mm Hg.  I have asked him to return for follow-up in 1 years time. The total time spent during this interview and exam with preparation and  chart review was 30 minutes.         Current Medications (including today's revisions)   acetaminophen (TYLENOL) 325 mg tablet Take two tablets by mouth every 6 hours as needed.    allopurinoL (ZYLOPRIM) 100 mg tablet Take two tablets by mouth at bedtime daily. Take with food.    allopurinoL (ZYLOPRIM) 300 mg tablet Take one tablet by mouth daily. Take with food.    aspirin EC 81 mg tablet Take one tablet by mouth daily. Take with food.    coQ10 (ubiquinol) 100 mg cap Take one capsule by mouth at bedtime daily.    fexofenadine (ALLEGRA) 180 mg tablet Take one tablet by mouth daily.    glucosamine-D3-Boswellia serr 1,500-400-100 mg-unit-mg tab Take 1 tablet by mouth daily.    losartan (COZAAR) 50 mg tablet Take one tablet by mouth daily.    MEN'S MULTI-VITAMIN PO Take 1 tablet by mouth daily.    nitroglycerin (NITROSTAT) 0.4 mg tablet Place one tablet under tongue every 5 minutes as needed for Chest Pain. Max of 3 tablets, call 911.    rosuvastatin (CRESTOR) 20 mg tablet Take one tablet by mouth at bedtime daily.    sodium chloride (SEA MIST) 0.65 % nasal spray Apply two sprays to each nostril as directed as Needed.    sour cherry extract (TART CHERRY EXTRACT) 1,000 mg cap Take one capsule by mouth daily.    triamcinolone (NASACORT) 55 mcg nasal inhaler Apply two sprays to each nostril as directed daily.

## 2022-10-27 ENCOUNTER — Encounter: Admit: 2022-10-27 | Discharge: 2022-10-27 | Payer: MEDICARE

## 2022-10-27 ENCOUNTER — Ambulatory Visit: Admit: 2022-10-27 | Discharge: 2022-10-27 | Payer: MEDICARE

## 2022-10-27 DIAGNOSIS — I729 Aneurysm of unspecified site: Secondary | ICD-10-CM

## 2022-10-27 LAB — POC CREATININE, RAD: CREATININE, POC: 1.2 mg/dL (ref 0.4–1.24)

## 2022-10-27 MED ORDER — IOHEXOL 350 MG IODINE/ML IV SOLN
70 mL | Freq: Once | INTRAVENOUS | 0 refills | Status: CP
Start: 2022-10-27 — End: ?
  Administered 2022-10-27: 22:00:00 70 mL via INTRAVENOUS

## 2022-10-27 MED ORDER — SODIUM CHLORIDE 0.9 % IJ SOLN
50 mL | Freq: Once | INTRAVENOUS | 0 refills | Status: CP
Start: 2022-10-27 — End: ?
  Administered 2022-10-27: 22:00:00 50 mL via INTRAVENOUS

## 2022-11-03 ENCOUNTER — Encounter: Admit: 2022-11-03 | Discharge: 2022-11-03 | Payer: MEDICARE

## 2022-11-03 DIAGNOSIS — I729 Aneurysm of unspecified site: Secondary | ICD-10-CM

## 2022-11-03 NOTE — Progress Notes
Interventional Radiology Progress Note  CTA head reviewed by [name] for aneurysm surveillance. Imaging remains stable and plan for patient to complete CTA head wo/w in one year. Orders placed and patient notified imaging stable and plan to follow up with CTA head in June 2025. Our office to call to facilitate scheduling imaging and our number provided for any questions or concerns.      Polo Riley, RN

## 2023-04-19 ENCOUNTER — Encounter: Admit: 2023-04-19 | Discharge: 2023-04-19 | Payer: MEDICARE

## 2023-04-22 ENCOUNTER — Ambulatory Visit: Admit: 2023-04-22 | Discharge: 2023-04-23 | Payer: MEDICARE

## 2023-04-22 ENCOUNTER — Ambulatory Visit: Admit: 2023-04-22 | Discharge: 2023-04-22 | Payer: MEDICARE

## 2023-04-22 ENCOUNTER — Encounter: Admit: 2023-04-22 | Discharge: 2023-04-22 | Payer: MEDICARE

## 2023-04-22 DIAGNOSIS — I6523 Occlusion and stenosis of bilateral carotid arteries: Secondary | ICD-10-CM

## 2023-04-22 DIAGNOSIS — I6522 Occlusion and stenosis of left carotid artery: Secondary | ICD-10-CM

## 2023-04-22 DIAGNOSIS — I10 Essential (primary) hypertension: Secondary | ICD-10-CM

## 2023-04-22 DIAGNOSIS — E785 Hyperlipidemia, unspecified: Secondary | ICD-10-CM

## 2023-04-22 DIAGNOSIS — Z8673 Personal history of transient ischemic attack (TIA), and cerebral infarction without residual deficits: Secondary | ICD-10-CM

## 2023-04-22 NOTE — Progress Notes
Date of Service: 04/22/2023              Chief Complaint   Patient presents with    Follow Up    Carotid Disease     1 YR       History of Present Illness    This is a very pleasant 66 year old gentleman with a past history of stroke in 2014 and again in February 2022.  He has a history of hypertension, hyperlipidemia, coronary artery disease and carotid stenosis.  He returns today for a bilateral carotid surveillance ultrasound and office visit.    He has less than 50% right carotid stenosis with a ratio of 0.9.  He has a chronically occluded left internal carotid artery.  He has antegrade flow listed in both of his vertebral arteries.  He has had no new lateralizing TIA or strokelike symptoms.Marland Kitchen      He reports having some intermittent left chest pain but this is not stress related.  He does have a cardiologist and is due to follow-up soon.  He denies shortness of breath.  He denies fever and chills.  He denies symptoms of claudication when he walks.    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  His most recent stroke on July 09, 2020 occurred in [place].  He reports that he had speech problems and right hand grasping problems with dropping items unexpectedly.  By the time he was evaluated at the hospital, too much time had elapsed for him to be able to have tPA.  Fortunately, he has had no significant residual deficits.  His prior stroke in 2014 was more obvious.  He reports his right arm went completely limp, he went immediately to the emergency room and did receive tPA.  At the time of his stroke in 2014, he had less than 50% stenosis bilaterally in his internal carotid arteries.  When he had his stroke in  February 2022, he was found to have a completely occluded left internal carotid artery.    He had a CTA of the head and neck  on August 21, 2020 which confirmed the occluded left internal carotid artery at the origin with retrograde flow into the distal ICA and with patent flow into the left ophthalmic artery.  He also had a minimal 2.5 mm aneurysm along the right MCA M1/M2 junction of the proximal M2 inferior division branch.  He continues to follow-up for the cerebral aneurysm.    He had  coronary artery stenting in October 2022 and coronary artery bypass grafting in January 2023.  He has no pacemaker or defibrillator implanted.  He has no history of radiation or chemotherapy for any reason.    He is not diabetic.  He has no history of renal insufficiency.  He does not smoke.    He is currently on aspirin and rosuvastatin daily.          Past Medical History:   Diagnosis Date    Aneurysm (HCC)     Arthritis     Carotid artery disease (HCC)     CTO Left, and 50% blockage on Right carotid    Cerebral aneurysm     Coronary artery disease     Coronary atherosclerosis 02/2021    Degenerative disc disease, cervical 2000    Degenerative disc disease, lumbar 2000    Degenerative disc disease, thoracic 2000    Diverticulitis  Diverticulitis of colon (without mention of hemorrhage)(562.11) 2007    Gout     Heart attack (HCC)     High cholesterol     HX: anticoagulation     Hypertension     Joint pain 2000    Kidney stones ???    Neuropathy     OSA on CPAP     Other specified disorders of arteries and arterioles (HCC)     Seasonal allergic reaction     Spinal stenosis ???    Stroke (HCC) 2014    Stroke [facility]) 06/2020    Umbilical hernia     Unspecified sinusitis (chronic) 12/31/1983       Surgical History:   Procedure Laterality Date    COLECTOMY  2007    HX EAR TUBES  02/25/2016    right shoulder arthroscopy, rotator cuff repair Right 02/28/2021    Performed by Dolan Amen, MD at [facility] OR    right shoulder arthroscopy,  distal clavicle excision Right 02/28/2021    Performed by Dolan Amen, MD at [facility] OR    right shoulder arthroscopy,  biceps tenotomy Right 02/28/2021    Performed by Dolan Amen, MD at [facility] OR CORONARY STENT PLACEMENT  03/04/2021    Coronary artery bypass grafting x 2 , internal mammary artery harvest, endoscopic left saphenous vein harvest, implantable loop recorder removal. N/A 06/05/2021    Performed by Virginia Crews, MD at [facility] CVOR    HEART CATHETERIZATION  06/21/2021    COLONOSCOPY      TONSIL AND ADENOIDECTOMY         Allergies:  Allergies   Allergen Reactions    Doxycycline MUSCLE PAIN    Colchicine DIARRHEA and NAUSEA ONLY     Body aches, diarrhea, nausea    Latex ITCHING, SEE COMMENTS and REDNESS     With prolonged exposure    Levaquin [Levofloxacin] SEE COMMENTS     Body aches and shakes    Plavix [Clopidogrel] SEE COMMENTS     Joint pain and muscle pain    Sulfa (Sulfonamide Antibiotics) ITCHING       Medication List:   acetaminophen (TYLENOL) 325 mg tablet Take two tablets by mouth every 6 hours as needed.    allopurinoL (ZYLOPRIM) 100 mg tablet Take two tablets by mouth at bedtime daily. Take with food.    allopurinoL (ZYLOPRIM) 300 mg tablet Take one tablet by mouth daily. Take with food.    aspirin EC 81 mg tablet Take one tablet by mouth daily. Take with food.    coQ10 (ubiquinol) 100 mg cap Take one capsule by mouth at bedtime daily.    fexofenadine (ALLEGRA) 180 mg tablet Take one tablet by mouth daily.    glucosamine-D3-Boswellia serr 1,500-400-100 mg-unit-mg tab Take 1 tablet by mouth daily.    losartan (COZAAR) 50 mg tablet Take one tablet by mouth daily.    MEN'S MULTI-VITAMIN PO Take 1 tablet by mouth daily.    nitroglycerin (NITROSTAT) 0.4 mg tablet Place one tablet under tongue every 5 minutes as needed for Chest Pain. Max of 3 tablets, call 911.    rosuvastatin (CRESTOR) 20 mg tablet Take one tablet by mouth at bedtime daily.    sodium chloride (SEA MIST) 0.65 % nasal spray Apply two sprays to each nostril as directed as Needed.    sour cherry extract (TART CHERRY EXTRACT) 1,000 mg cap Take one capsule by mouth daily.    triamcinolone (NASACORT) 55 mcg nasal inhaler Apply  two sprays to each nostril as directed daily.       Social History:   reports that he has never smoked. He has never used smokeless tobacco. He reports current alcohol use of about 2.0 standard drinks of alcohol per week. He reports that he does not use drugs.    Family History   Problem Relation Name Age of Onset    COPD Mother [place]     Arthritis Mother [place]     Back pain Mother [place]     High Cholesterol Father Addiel Gleissner     Heart Attack Brother Depaul Jackovich     Asthma Brother Aidynn Martell     Coronary Artery Disease Brother Mordche Parpart     Heart Disease Brother Bralyn Pesola         s/p PCI with stent placement    Coronary Artery Disease Brother Claiborn Mccalip     Heart Disease Maternal Grandfather      Diabetes Daughter Rosalio Macadamia Disease Daughter Victorino Dike        Review of Systems            Vitals:    04/22/23 1335 04/22/23 1341   BP: (!) 178/83 (!) 170/79   BP Source: Arm, Left Upper Arm, Right Upper   Pulse: 66 65   Temp: 36.8 ?C (98.2 ?F)    TempSrc: Temporal    PainSc: Zero    Weight: 90.7 kg (200 lb)    Height: 170.2 cm (5' 7)      Body mass index is 31.32 kg/m?Marland Kitchen     Physical Exam  Vitals and nursing note reviewed.   Constitutional:       General: He is not in acute distress.     Appearance: Normal appearance. He is well-developed, well-groomed and normal weight. He is not ill-appearing or diaphoretic.   HENT:      Head: Normocephalic.   Eyes:      General:         Right eye: No discharge.         Left eye: No discharge.   Neck:      Vascular: No carotid bruit.      Trachea: Trachea and phonation normal.   Cardiovascular:      Rate and Rhythm: Normal rate and regular rhythm.      Pulses:           Radial pulses are 2+ on the right side and 2+ on the left side.      Heart sounds: Normal heart sounds.   Pulmonary:      Effort: Pulmonary effort is normal. No respiratory distress.      Breath sounds: Normal breath sounds.   Musculoskeletal:      Cervical back: Normal range of motion and neck supple.   Skin:     General: Skin is warm and dry.      Capillary Refill: Capillary refill takes less than 2 seconds.      Coloration: Skin is not cyanotic or mottled.   Neurological:      General: No focal deficit present.      Mental Status: He is alert and oriented to person, place, and time.      Motor: Motor function is intact.      Coordination: Coordination is intact.      Gait: Gait is intact.      Comments: His grip strength is strong and equal bilaterally.  Psychiatric:         Attention and Perception: Attention and perception normal.         Mood and Affect: Mood and affect normal.         Speech: Speech normal.         Behavior: Behavior normal. Behavior is cooperative.         Thought Content: Thought content normal.         Cognition and Memory: Cognition and memory normal.         Judgment: Judgment normal.             Assessment and Plan:    1. Bilateral carotid artery stenosis  VAS US DUPLEX SCAN CAROTID BILATERAL      2. Left carotid artery occlusion        3. Essential hypertension        4. Hyperlipidemia, unspecified hyperlipidemia type        5. History of stroke          He continues to have less than 50% right carotid stenosis and a chronically occluded left internal carotid artery.  He has had no new lateralizing TIA or strokelike symptoms.  I discussed with him his results and recommendations for follow-up.  We discussed that from a vascular surgery standpoint, low-dose aspirin is sufficient.  He needs to follow-up with cardiology regarding his left-sided intermittent chest pain.  We discussed good blood pressure control, good cholesterol control and continued statin therapy to help reduce atherosclerotic risk factors.    He may continue low-dose aspirin and rosuvastatin daily.    We will follow-up again in 1 year with a bilateral carotid surveillance ultrasound and office visit at that time.    We did give him information on carotid disease for his visit summary.  He had no additional questions or concerns for me at this time.    Kathaleen Maser, APRN-NP    >  20    Minutes spent on chart review, physical exam and documentation.

## 2023-06-25 ENCOUNTER — Encounter: Admit: 2023-06-25 | Discharge: 2023-06-25 | Payer: MEDICARE

## 2023-10-13 ENCOUNTER — Encounter: Admit: 2023-10-13 | Discharge: 2023-10-13 | Payer: MEDICARE

## 2023-10-17 NOTE — Progress Notes
 Cardiovascular Medicine       Date of Service: 10/18/2023    Ronnie Williams is a 68 y.o. male.       HPI     Ronnie Williams is an exceptionally pleasant 68 year old male patient who was seen in the cardiology clinic at the [facility] for his annual follow up.  He is accompanied by his wife Haskell Linker today. He typically follows with Dr. Anda Bamberg. He has a past medical history of coronary artery disease status post STEMI with PCI placed to left circumflex (October 2022) followed by CABG x 2 (January 2023), hypertension, hyperlipidemia, carotid artery disease with a chronic total occlusion of the left and 50% stenosis on right carotid artery, stroke 2016 and January 2022, and obstructive sleep apnea.    Ronnie Williams was last seen in office on 10/01/2022 by his primary cardiologist Dr. Anda Bamberg.  At that time he notes he was doing well and he completed his cardiac rehab without difficulty.  He is not describing any acute cardiac symptoms at that time.  No changes are made at the time of his office visit.    Today he reports he is doing well.  He has not had any changes to his symptoms.  His blood pressure is slightly elevated in office although he checks it at home intermittently about once a week and notes that his average systolic readings are in the 130s.  He [place] of land and frequently mows his lawn with a push mower.  He has not had any chest pain, shortness of breath, palpitations, presyncope or syncope with all of his physical activity.  He does cardiac rehab in the winter and did well with this. He and his wife enjoy spending time with their children and grandchildren in the area.     Pertinent cardiac studies:  Preliminary ECG in office today shows     Cardiac catheterization 06/20/2021;  CONCLUSIONS:    Single-vessel obstructive severe coronary artery disease and a native ostium of the left anterior descending with patent left internal mammary artery graft to distal left anterior descending and saphenous vein graft to the diagonal artery.  Circumflex/obtuse marginal stent in the left dominant circulation with no evidence of significant disease in the circumflex artery.  Right coronary artery is a small nondominant vessel with no significant disease.    Echocardiogram 06/20/2021; EF 60%, basal inferolateral and mid inferolateral hypokinesis; wall motion abnormality likely due to postoperative state, no significant valvular abnormalities noted    Carotid duplex 04/22/2023  Less than 50% right internal carotid artery stenosis, chronic total occlusion of the left internal carotid artery, antegrade flow in bilateral vertebral arteries    Assessment and Plan     No changes were made to Ronnie Williams's plan of care today.  He is doing very well.     Coronary artery disease status post STEMI October 22 and CABG x 19 May 2021  LIMA graft to the left anterior descending coronary artery and reverse saphenous vein graft to the diagonal vessel  Chest pain in February 2023; cardiac catheterization February 2023 with patent stent in obtuse marginal as well as patent bypass grafts  Continues on aspirin 81 mg daily and rosuvastatin 20 mg daily  Hypertension  Blood pressure in office today is elevated, I have asked him to keep a blood pressure log at home and send us  the results after 1 week.  He has reported good blood pressures at home with  an average systolic reading in 130s, why I have not adjusted medications today  Continues on losartan 50 mg daily  Hyperlipidemia  His most recent lipid panel included:  Lab Results   Component Value Date    LDL 57 02/16/2022    HDL 48 02/16/2022    CHOL 161 02/16/2022    TRIG 78 02/16/2022    AST 21 06/21/2021    ALT 14 02/16/2022   LDL goal under 55, almost at goal, continue on rosuvastatin 20 mg daily  Requested most recent lab results in the last year from [facility]  Carotid artery disease  Continues on aspirin 81 mg daily, did not tolerate Plavix due to muscle aches  Carotid artery duplex December 2024 as listed above  Denies any stroke-like symptoms or syncope      Patient's questions were answered and they agreed with the above plan.  Specific instructions were typed into their After Visit Summary document.  Follow-up in 12 months or sooner as needed.  Thank you for the opportunity to participate in the care of your patient.  Please call with questions or concerns.     Jeppie Moles, DNP, APRN, FNP-C  Collaborating Physician: Dr. Michela Aguas  Cardiovascular Medicine at [facility]  Phone: [phone]      Total Time Today was 45 minutes in the following activities: Preparing to see the patient, Obtaining and/or reviewing separately obtained history, Performing a medically appropriate examination and/or evaluation, Counseling and educating the patient/family/caregiver, and Ordering medications, tests, or procedures            Vitals:    10/18/23 0948   BP: (!) 166/84   BP Source: Arm, Right Upper   Pulse: 63   SpO2: 96%   O2 Device: None (Room air)   PainSc: Zero   Weight: 92.4 kg (203 lb 9.6 oz)   Height: 170.2 cm (5' 7)     Body mass index is 31.89 kg/m?Ronnie Williams     Past Medical History  Patient Active Problem List    Diagnosis Date Noted    Excessive daytime sleepiness 02/11/2022    Tooth abscess 06/20/2021    Unstable angina (CMS-HCC) 06/19/2021    Chest pain 06/19/2021    Hyponatremia 06/12/2021    Hypokalemia 06/10/2021    Thrombocytopenia 06/06/2021    Acute blood loss anemia 06/06/2021    AKI (acute kidney injury) 06/06/2021    S/P CABG x 2 06/05/2021    Stress hyperglycemia 06/05/2021    History of stroke 06/04/2021    S/P coronary artery stent placement 03/26/2021     To circumflex artery      HX: anticoagulation 03/26/2021    Diverticulitis 03/26/2021    Carotid artery disease 03/26/2021     CTO Left, and 50% blockage on Right carotid      Cerebral aneurysm 03/26/2021    S/P rotator cuff repair 03/18/2021    Coronary artery disease of native artery of native heart with stable angina pectoris 03/06/2021     03/04/21: STEMI/Cath - The mid left circumflex artery has a 99% stenosis.  This is the culprit lesion for the patient's current presentation. The ostium of the LAD has a 70% stenosis.  The mid LAD has 60-70% long segment of stenosis. The 2nd diagonal branch has 70-80% stenosis in the proximal segment. Successful PCI of the mid left circumflex artery with 4.0 x 15 mm Onyx Frontier DES. Measurement of RFR across the LAD was 0.63 suggesting that the stenosis  is hemodynamically significant. Intravascular ultrasound of the left main coronary artery revealed minimal lumen area of 20.8 sq mm.  06/05/21: Coronary artery bypass grafting x 2 (LIMA to LAD, SVG to Diag) & implantable loop recorder removal.           Acute ST elevation myocardial infarction (STEMI) (CMS-HCC) 03/04/2021    ST elevation myocardial infarction (STEMI) (CMS-HCC) 03/04/2021     03/04/21- Coronary Angiogram- The mid left circumflex artery has a 99% stenosis.  This is the culprit lesion for the patient's current presentation.  The ostium of the left anterior descending artery has a 70% stenosis.  The mid left anterior descending artery has 60% to 70% long segment of stenosis.  The 2nd diagonal branch has 70% to 80% stenosis in the proximal segment.  Elevated left ventricular end-diastolic pressure.  No significant gradient across the aortic valve on pullback.  Successful percutaneous coronary intervention of the mid left circumflex artery with 4.0 x 15 mm Onyx Frontier drug-eluting stent postdilated with 4.5 NC.  Measurement of RFR across the left anterior descending artery was 0.63 suggesting that the stenosis is hemodynamically significant.  Intravascular ultrasound of the left main coronary artery revealed minimal lumen area of 20.8 sq mm.        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/13/2021    Ear fullness, left 05/08/2020    Family history of chronic ischemic heart disease 08/05/2016    Shortness of breath 08/05/2016    Other hyperlipidemia 08/05/2016    Essential hypertension 08/05/2016    Eustachian tube dysfunction, left 04/29/2016         ROS  Review of Systems:   Constitutional: denies fever, chills, night sweats, weight loss  Eyes: denies double vision, sudden vision loss  ENT & mouth: no ringing in the ears, mucosas are moist  Cardiovascular: denies chest pain, palpitations  Respiratory:denies cough, shortness of breath  GI: denies nausea, vomiting, diarrhea, constipation  GU: denies increased urinary frequency, dysuria  Musculoskeletal: denies joint pain, weakness  Skin: denies new skin rash or skin lesions  Neurological: denies dizziness, denies loss of consciousness   Psychiatric: denies depression, suicidal ideation  Endocrine: denies heat or cold intolerance  Hematology/lymphatic: denies abnormal bleeding, bruising, new lymph nodes     Physical Exam  BP (!) 166/84 (BP Source: Arm, Right Upper, Patient Position: Sitting)  - Pulse 63  - Ht 170.2 cm (5' 7)  - Wt 92.4 kg (203 lb 9.6 oz)  - SpO2 96%  - BMI 31.89 kg/m?   BP (!) 166/84 (BP Source: Arm, Right Upper, Patient Position: Sitting)  - Pulse 63  - Ht 170.2 cm (5' 7)  - Wt 92.4 kg (203 lb 9.6 oz)  - SpO2 96%  - BMI 31.89 kg/m?   GENERAL: The patient is well developed, well nourished, resting comfortably and in no distress.   HEENT: No abnormalities of the visible oro-nasopharynx, conjunctiva or sclera are noted.  NECK: There is no jugular venous distension. Carotids are palpable and without bruits. There is no thyroid enlargement.  Chest: Lung fields are clear to auscultation. There are no wheezes or crackles.  CV: There is a regular rhythm. The first and  second heart sounds are normal. There are no murmurs, gallops or rubs.sternal scar healed.   ABD: The abdomen is soft and supple with normal bowel sounds. There is no hepatosplenomegaly, ascites, tenderness, masses or bruits.  Neuro: There are no focal motor defects. Ambulation is normal. Cognitive function appears normal.  Ext: There is no edema or evidence of deep vein thrombosis. Peripheral pulses are satisfactory.    SKIN: There are no rashes and no cellulitis  PSYCH: The patient is calm, rationale and oriented.          Cardiovascular Studies  Noted in HPI     Cardiovascular Health Factors  Vitals BP Readings from Last 3 Encounters:   10/18/23 (!) 166/84   04/22/23 (!) 170/79   10/01/22 135/83     Wt Readings from Last 3 Encounters:   10/18/23 92.4 kg (203 lb 9.6 oz)   04/22/23 90.7 kg (200 lb)   10/01/22 89.3 kg (196 lb 12.8 oz)     BMI Readings from Last 3 Encounters:   10/18/23 31.89 kg/m?   04/22/23 31.32 kg/m?   10/01/22 30.82 kg/m?      Smoking Social History     Tobacco Use   Smoking Status Never   Smokeless Tobacco Never      Lipid Profile Cholesterol   Date Value Ref Range Status   02/16/2022 120  Final     HDL   Date Value Ref Range Status   02/16/2022 48  Final     LDL   Date Value Ref Range Status   02/16/2022 57  Final     Triglycerides   Date Value Ref Range Status   02/16/2022 78  Final      Blood Sugar Hemoglobin A1C   Date Value Ref Range Status   06/04/2021 6.1 (H) 4.0 - 6.0 % Final     Comment:     The ADA recommends that most patients with type 1 and type 2 diabetes maintain   an A1c level <7%.       Glucose   Date Value Ref Range Status   02/16/2022 98  Final   06/21/2021 102 (H) 70 - 100 MG/DL Final   96/08/5407 811 (H) 70 - 100 MG/DL Final     Glucose, POC   Date Value Ref Range Status   06/11/2021 117 (H) 70 - 100 MG/DL Final   91/47/8295 621 (H) 70 - 100 MG/DL Final   30/86/5784 696 (H) 70 - 100 MG/DL Final          Problems Addressed Today  Encounter Diagnoses   Name Primary?    Other hyperlipidemia Yes    Coronary artery disease of native artery of native heart with stable angina pectoris     S/P CABG x 2     Occlusion of left carotid artery     Essential hypertension         Assessment and Plan above           Current Medications (including today's revisions)   acetaminophen (TYLENOL) 325 mg tablet Take two tablets by mouth every 6 hours as needed.    allopurinoL (ZYLOPRIM) 100 mg tablet Take two tablets by mouth at bedtime daily. Take with food.    allopurinoL (ZYLOPRIM) 300 mg tablet Take one tablet by mouth daily. Take with food.    aspirin EC 81 mg tablet Take one tablet by mouth daily. Take with food.    coQ10 (ubiquinol) 100 mg cap Take one capsule  by mouth at bedtime daily.    fexofenadine (ALLEGRA) 180 mg tablet Take one tablet by mouth daily.    glucosamine-D3-Boswellia serr 1,500-400-100 mg-unit-mg tab Take 1 tablet by mouth daily.    losartan (COZAAR) 50 mg tablet Take one tablet by mouth daily.    MEN'S MULTI-VITAMIN PO Take 1 tablet by mouth daily.    nitroglycerin (NITROSTAT) 0.4 mg tablet Place one tablet under tongue every 5 minutes as needed for Chest Pain. Max of 3 tablets, call 911.    rosuvastatin (CRESTOR) 20 mg tablet Take one tablet by mouth at bedtime daily.    sodium chloride (SEA MIST) 0.65 % nasal spray Apply two sprays to each nostril as directed as Needed.    sour cherry extract (TART CHERRY EXTRACT) 1,000 mg cap Take one capsule by mouth daily.    triamcinolone (NASACORT) 55 mcg nasal inhaler Apply two sprays to each nostril as directed daily.

## 2023-10-18 ENCOUNTER — Encounter: Admit: 2023-10-18 | Discharge: 2023-10-18 | Payer: MEDICARE

## 2023-10-18 ENCOUNTER — Ambulatory Visit: Admit: 2023-10-18 | Discharge: 2023-10-19 | Payer: MEDICARE

## 2023-10-18 DIAGNOSIS — E7849 Other hyperlipidemia: Secondary | ICD-10-CM

## 2023-10-18 DIAGNOSIS — I6522 Occlusion and stenosis of left carotid artery: Secondary | ICD-10-CM

## 2023-10-18 DIAGNOSIS — I25118 Atherosclerotic heart disease of native coronary artery with other forms of angina pectoris: Secondary | ICD-10-CM

## 2023-10-18 DIAGNOSIS — Z951 Presence of aortocoronary bypass graft: Secondary | ICD-10-CM

## 2023-10-18 DIAGNOSIS — I10 Essential (primary) hypertension: Secondary | ICD-10-CM

## 2023-10-18 NOTE — Progress Notes
 STAT request please    Patient DOB: [birth year]    Please fax the following information for continuation of care:     Recent Labs      Patient has a cardiology appointment with Courtenay Distel, APRN today      *Please fax to [phone]    Thank you!  Toy Freund, RN

## 2023-10-18 NOTE — Patient Instructions
 It was nice to meet you in clinic today.    We discussed:  Blood pressure  Hyperlipidemia   Hypertension   Carotid artery disease     I would like to make the following medication adjustments:  None at this time     Otherwise continue the same medications as you have been doing.  Please keep a blood pressure log and send us  results after one week.     We will be pursuing the following tests after your appointment today:  We will request lab work from [facility]     I will plan to see you back in about 12 months.  Please call us  in the meantime with any questions or concerns.    The Cardiovascular Medicine Marrie Sizer Team nurse Jenine Mix Jerrold Morgan, or Jeni Mitten) number is [phone].  If you have not heard the results of your testing in more than 1 week after it has been performed, please give us  a call so that we may investigate further.  If you need to schedule an appointment, the green team scheduling number is [phone].    NOTE: MyChart messages and phone calls received on weekends, on holidays, and after 4 pm on weekdays will NOT be seen until the following business day. If you have an urgent matter during these times, please call [phone] to reach the on-call team.     If you need prescription refills, please contact your pharmacy.     Call [phone] for scheduling.     Jeppie Moles, APRN

## 2023-11-10 ENCOUNTER — Encounter: Admit: 2023-11-10 | Discharge: 2023-11-10 | Payer: MEDICARE

## 2023-11-17 ENCOUNTER — Encounter: Admit: 2023-11-17 | Discharge: 2023-11-17 | Payer: MEDICARE

## 2023-11-17 DIAGNOSIS — E7849 Other hyperlipidemia: Principal | ICD-10-CM

## 2023-11-17 DIAGNOSIS — I10 Essential (primary) hypertension: Principal | ICD-10-CM

## 2023-11-17 DIAGNOSIS — Z951 Presence of aortocoronary bypass graft: Secondary | ICD-10-CM

## 2023-11-17 DIAGNOSIS — I25118 Atherosclerotic heart disease of native coronary artery with other forms of angina pectoris: Secondary | ICD-10-CM

## 2023-11-17 MED ORDER — LOSARTAN 50 MG PO TAB
50 mg | ORAL_TABLET | Freq: Every day | ORAL | 3 refills | 90.00000 days | Status: AC
Start: 2023-11-17 — End: ?

## 2023-11-17 NOTE — Telephone Encounter
-----   Message from Ronal JAYSON Charleston, APRN-NP sent at 11/17/2023  4:00 PM CDT -----  Ok, I would have him get another lipid profile whenever he gets labs next, LDL should be under 55, so with those labs not at goal but also not recent enough. Thanks for getting those!    Elveria  ----- Message -----  From: Arcelia Duncans, RN  Sent: 11/17/2023  12:34 PM CDT  To: Ronal JAYSON Charleston, APRN-NP    Was able to get his labs from PCP. These were from November 2025. LDL not at goal here, but again this was from 10 months ago. Thanks.

## 2023-11-17 NOTE — Telephone Encounter
 I called and spoke to Ronnie Williams about his labs. He is agreeable and willing to repeat cholesterol levels at this time. He also wanted to have an LP (a) drawn as well. I spoke to [place] over the phone and ok to order. Labs will be faxed to [facility] in [place].     FAX: [phone].     I asked him to be fasting at least 8-12 hours when he completes the labs. All questions answered.

## 2023-11-23 ENCOUNTER — Encounter: Admit: 2023-11-23 | Discharge: 2023-11-23 | Payer: MEDICARE

## 2023-11-23 DIAGNOSIS — E7849 Other hyperlipidemia: Principal | ICD-10-CM

## 2023-11-23 DIAGNOSIS — Z951 Presence of aortocoronary bypass graft: Secondary | ICD-10-CM

## 2023-11-23 DIAGNOSIS — I25118 Atherosclerotic heart disease of native coronary artery with other forms of angina pectoris: Secondary | ICD-10-CM

## 2023-11-23 LAB — LIPID PROFILE
CHOLESTEROL: 124
HDL: 46
LDL: 62
TRIGLYCERIDES: 84

## 2023-11-23 LAB — LIPOPROTEIN A: LIPOPROTEIN A: 203 — ABNORMAL HIGH

## 2023-11-23 NOTE — Telephone Encounter
-----   Message from Ronal JAYSON Charleston, APRN-NP sent at 11/23/2023  4:07 PM CDT -----  His LDL goal is technically under 55, he is very close to his goal. We can ask him to keep working on his diet and I think he can get it there before we try and increase to 40. Will see what LPa   shows to make determination if he needs Repatha.    Elveria  ----- Message -----  From: Arcelia Duncans, RN  Sent: 11/23/2023   1:10 PM CDT  To: Ronal JAYSON Charleston, APRN-NP    We repeated a FLP since the one we received back was from late last fall. He remains on Rosuvastatin  20mg . Still waiting on LP (a) that he requested be ordered as well. Thank you. Enis   ----- Message -----  From: Arcelia Duncans, RN  Sent: 11/23/2023   1:07 PM CDT  To: [facility] Nurse Lab Results

## 2023-11-30 ENCOUNTER — Encounter: Admit: 2023-11-30 | Discharge: 2023-11-30 | Payer: MEDICARE

## 2023-11-30 DIAGNOSIS — I25118 Atherosclerotic heart disease of native coronary artery with other forms of angina pectoris: Secondary | ICD-10-CM

## 2023-11-30 DIAGNOSIS — Z951 Presence of aortocoronary bypass graft: Secondary | ICD-10-CM

## 2023-11-30 DIAGNOSIS — E7849 Other hyperlipidemia: Principal | ICD-10-CM

## 2023-11-30 NOTE — Progress Notes
 Request for the following medical records for purpose of continuity of care:       PT: Ronnie Williams     DOB: 04-10-1956      Pt completed labs on 11/23/2023. We received Lipid Profile back. Did the second order, Lipoprotein A order complete? If so, please fax results. Thank you.       Please Fax to: [phone]  Cardiology Services at the [facility]   Dr. Debroah  Attention:  Rockey, RN    Thank you

## 2023-12-03 ENCOUNTER — Encounter: Admit: 2023-12-03 | Discharge: 2023-12-03 | Payer: MEDICARE

## 2023-12-04 ENCOUNTER — Encounter: Admit: 2023-12-04 | Discharge: 2023-12-04 | Payer: MEDICARE

## 2023-12-04 NOTE — Progress Notes
 Pharmacy Benefits Investigation    Medication name: alirocumab (PRALUENT PEN) 75 mg/mL injectable PEN  Medication status: new    The insurance requires a prior authorization for the medication. The prior authorization was submitted via CoverMyMeds. Will provide update when available from payor or within 4 business days.    PA number: [id number]  Odis Nett  Specialty Pharmacy Patient Advocate

## 2023-12-05 ENCOUNTER — Encounter: Admit: 2023-12-05 | Discharge: 2023-12-05 | Payer: MEDICARE

## 2023-12-09 ENCOUNTER — Encounter: Admit: 2023-12-09 | Discharge: 2023-12-09 | Payer: MEDICARE

## 2023-12-09 ENCOUNTER — Ambulatory Visit: Admit: 2023-12-09 | Discharge: 2023-12-09 | Payer: MEDICARE

## 2023-12-09 NOTE — Progress Notes
 Pharmacy Benefits Investigation    Medication name: alirocumab (PRALUENT PEN) 75 mg/mL injectable PEN  Medication status: new    The prior authorization was approved for [employer] (PA number [id number]) from 12/04/2023 through n/a (no specified end date).    The out of pocket cost today is $717.62 for 84 days. This cost may change due to factors including but not limited to changes in insurance coverage.    A grant is available through [employer]. An application has been submitted for [employer] and is pending. Will follow up in 2 business days if no response.    Monico Oris  Specialty Pharmacy Patient Advocate

## 2023-12-10 ENCOUNTER — Encounter: Admit: 2023-12-10 | Discharge: 2023-12-10 | Payer: MEDICARE

## 2023-12-10 NOTE — Progress Notes
 Pharmacy Benefits Investigation    Medication name: alirocumab  (PRALUENT  PEN) 75 mg/mL injectable PEN  Medication status: new    The out of pocket cost today is $717.62. This cost may change due to factors including but not limited to changes in insurance coverage.    A grant is available through [employer]. A grant was obtained and will provide the patient with $ through 11/08/2024.    After assistance, the final out of pocket cost is $0.    The copay is affordable. Per patient's request, the medication will be shipped to the patient's address.    Monico Oris  Specialty Pharmacy Patient Advocate

## 2023-12-14 ENCOUNTER — Encounter: Admit: 2023-12-14 | Discharge: 2023-12-14 | Payer: MEDICARE

## 2023-12-16 ENCOUNTER — Encounter: Admit: 2023-12-16 | Discharge: 2023-12-16 | Payer: MEDICARE

## 2023-12-16 MED FILL — PRALUENT PEN 75 MG/ML SC PNIJ: 75 mg/mL | SUBCUTANEOUS | 84 days supply | Qty: 6 | Fill #0 | Status: AC

## 2024-01-11 ENCOUNTER — Encounter: Admit: 2024-01-11 | Discharge: 2024-01-11 | Payer: MEDICARE

## 2024-03-14 ENCOUNTER — Encounter: Admit: 2024-03-14 | Discharge: 2024-03-14 | Payer: MEDICARE

## 2024-03-15 ENCOUNTER — Encounter: Admit: 2024-03-15 | Discharge: 2024-03-15 | Payer: MEDICARE

## 2024-03-15 MED FILL — PRALUENT PEN 75 MG/ML SC PNIJ: 75 mg/mL | SUBCUTANEOUS | 84 days supply | Qty: 6 | Fill #1 | Status: AC

## 2024-04-21 ENCOUNTER — Encounter: Admit: 2024-04-21 | Discharge: 2024-04-21 | Payer: MEDICARE

## 2024-04-21 ENCOUNTER — Ambulatory Visit: Admit: 2024-04-21 | Discharge: 2024-04-21 | Payer: MEDICARE

## 2024-04-21 VITALS — BP 146/89 | HR 65 | Temp 98.10000°F | Ht 67.0 in | Wt 207.0 lb

## 2024-04-21 DIAGNOSIS — E785 Hyperlipidemia, unspecified: Secondary | ICD-10-CM

## 2024-04-21 DIAGNOSIS — I6522 Occlusion and stenosis of left carotid artery: Secondary | ICD-10-CM

## 2024-04-21 DIAGNOSIS — I10 Essential (primary) hypertension: Secondary | ICD-10-CM

## 2024-04-21 DIAGNOSIS — I6523 Occlusion and stenosis of bilateral carotid arteries: Principal | ICD-10-CM

## 2024-04-21 NOTE — Progress Notes [1]
 Date of Service: 04/21/2024              Chief Complaint   Patient presents with    Follow Up       History of Present Illness    This is a very pleasant 68 year old gentleman with a past history of stroke in 2014 and again in February 2022.  He has a history of hypertension, hyperlipidemia, coronary artery disease and carotid stenosis.  He returns today for a bilateral carotid surveillance ultrasound and office visit.    He has less than 50% right carotid stenosis with a ratio of 1.0.  He has a chronically occluded left internal carotid artery.  He has antegrade flow listed in both of his vertebral arteries.  He has had no new lateralizing TIA or strokelike symptoms..      He denies chest pain and shortness of breath.  He denies fever and chills.  He denies ischemic rest pain and tissue breakdown in either foot.  He denies symptoms of claudication when he walks.    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  His most recent stroke on July 09, 2020 occurred in [place] .  He reports that he had speech problems and right hand grasping problems with dropping items unexpectedly.  By the time he was evaluated at the hospital, too much time had elapsed for him to be able to have tPA.  Fortunately, he has had no significant residual deficits.  His prior stroke in 2014 was more obvious.  He reports his right arm went completely limp, he went immediately to the emergency room and did receive tPA.  At the time of his stroke in 2014, he had less than 50% stenosis bilaterally in his internal carotid arteries.  When he had his stroke in  February 2022, he was found to have a completely occluded left internal carotid artery.    He had a CTA of the head and neck  on August 21, 2020 which confirmed the occluded left internal carotid artery at the origin with retrograde flow into the distal ICA and with patent flow into the left ophthalmic artery.  He also had a minimal 2.5 mm aneurysm along the right MCA M1/M2 junction of the proximal M2 inferior division branch.  He continues to follow-up for the cerebral aneurysm.    He had  coronary artery stenting in October 2022 and coronary artery bypass grafting in January 2023.  He has no pacemaker or defibrillator implanted.  He has no history of radiation or chemotherapy for any reason.    He is not diabetic.  He has no history of renal insufficiency.  He does not smoke.    He is currently on aspirin  and rosuvastatin  daily, and takes Praluent  as directed.          Past Medical History:    Aneurysm    Arthritis    Back pain    Carotid artery disease    Cerebral aneurysm    Coronary artery disease    Coronary atherosclerosis    Degenerative disc disease, cervical    Degenerative disc disease, lumbar    Degenerative disc disease, thoracic    Diverticulitis    Diverticulitis of colon (without mention of hemorrhage)(562.11)    Gout    Heart attack (CMS-HCC)    High cholesterol    HX: anticoagulation    Hypertension    Joint pain  Kidney stones    Neuropathy    OSA on CPAP    Other specified disorders of arteries and arterioles    Seasonal allergic reaction    Spinal stenosis    Stroke (CMS-HCC)    Stroke (CMS-HCC)    Umbilical hernia    Unspecified sinusitis (chronic)       Surgical History:   Procedure Laterality Date    COLECTOMY  2007    HX EAR TUBES  02/25/2016    right shoulder arthroscopy, rotator cuff repair Right 02/28/2021    Performed by Dann DOROTHA Mt, MD at [facility] OR    right shoulder arthroscopy,  distal clavicle excision Right 02/28/2021    Performed by Dann DOROTHA Mt, MD at [facility] OR    right shoulder arthroscopy,  biceps tenotomy Right 02/28/2021    Performed by Dann DOROTHA Mt, MD at [facility] OR    CORONARY STENT PLACEMENT  03/04/2021    Coronary artery bypass grafting x 2 , internal mammary artery harvest, endoscopic left saphenous vein harvest, implantable loop recorder removal. N/A 06/05/2021 Performed by Wyn Maude RAMAN, MD at [facility]    HEART CATHETERIZATION  06/21/2021    COLON SURGERY  02/2013    COLONOSCOPY      HX HEART CATHETERIZATION  03/04/2021    HX TONSILLECTOMY  1961    HX VASECTOMY  11/1994    SHOULDER SURGERY  03/01/2021    Rotater cuff repair    TONSIL AND ADENOIDECTOMY      UMBILICAL ARTERIAL CATH - BEDSIDE  03/04/21       Allergies:  Allergies   Allergen Reactions    Doxycycline MUSCLE PAIN    Colchicine  DIARRHEA and NAUSEA ONLY     Body aches, diarrhea, nausea    Latex ITCHING, SEE COMMENTS and REDNESS     With prolonged exposure    Levaquin [Levofloxacin] SEE COMMENTS     Body aches and shakes    Plavix [Clopidogrel] SEE COMMENTS     Joint pain and muscle pain    Sulfa (Sulfonamide Antibiotics) ITCHING       Medication List:   acetaminophen  (TYLENOL ) 325 mg tablet Take two tablets by mouth every 6 hours as needed.    alirocumab  (PRALUENT  PEN) 75 mg/mL injectable PEN Inject 1 mL under the skin every 14 days.    allopurinoL  (ZYLOPRIM ) 100 mg tablet Take two tablets by mouth at bedtime daily. Take with food.    allopurinoL  (ZYLOPRIM ) 300 mg tablet Take one tablet by mouth daily. Take with food.    aspirin  EC 81 mg tablet Take one tablet by mouth daily. Take with food.    coQ10 (ubiquinol) 100 mg cap Take one capsule by mouth at bedtime daily.    fexofenadine (ALLEGRA) 180 mg tablet Take one tablet by mouth daily.    glucosamine-D3-Boswellia serr 1,500-400-100 mg-unit-mg tab Take 1 tablet by mouth daily.    losartan  (COZAAR ) 50 mg tablet Take one tablet by mouth daily.    MEN'S MULTI-VITAMIN PO Take 1 tablet by mouth daily.    nitroglycerin  (NITROSTAT ) 0.4 mg tablet Place one tablet under tongue every 5 minutes as needed for Chest Pain. Max of 3 tablets, call 911.    rosuvastatin  (CRESTOR ) 20 mg tablet Take one tablet by mouth at bedtime daily.    sodium chloride  (SEA MIST) 0.65 % nasal spray Apply two sprays to each nostril as directed as Needed.    sour cherry extract (TART CHERRY EXTRACT) 1,000 mg cap Take one  capsule by mouth daily.    triamcinolone  (NASACORT ) 55 mcg nasal inhaler Apply two sprays to each nostril as directed daily.       Social History:   reports that he has never smoked. He has never used smokeless tobacco. He reports current alcohol use of about 2.0 standard drinks of alcohol per week. He reports that he does not use drugs.    Family History   Problem Relation Name Age of Onset    COPD Mother Virginia  Behler     Arthritis Mother Virginia  [place]     Back pain Mother Virginia  Moten     High Cholesterol Father Gustavo Dispenza     Heart Attack Brother Stacy Deshler     Asthma Brother Rodd Heft     Coronary Artery Disease Brother Estanislado Surgeon     Heart Disease Brother Que Meneely         s/p PCI with stent placement    Coronary Artery Disease Brother Laverne Klugh     Heart Disease Maternal Grandfather      Diabetes Daughter Delon Chill Disease Daughter Delon     Hip Fracture Mother Virginia  Tesmer        Review of Systems            Vitals:    04/21/24 1412   BP: (!) 146/89   BP Source: Arm, Left Upper   Pulse: 65   Temp: 36.7 ?C (98.1 ?F)   TempSrc: Temporal   PainSc: Zero   Weight: 93.9 kg (207 lb)   Height: 170.2 cm (5' 7)     Body mass index is 32.42 kg/m?SABRA     Physical Exam  Vitals and nursing note reviewed. Exam conducted with a chaperone present (He is accompanied by his wife today.).   Constitutional:       General: He is not in acute distress.     Appearance: Normal appearance. He is well-developed, well-groomed and normal weight. He is not ill-appearing or diaphoretic.   HENT:      Head: Normocephalic.   Eyes:      General:         Right eye: No discharge.         Left eye: No discharge.   Neck:      Vascular: No carotid bruit.      Trachea: Phonation normal.   Cardiovascular:      Rate and Rhythm: Normal rate and regular rhythm.      Pulses:           Radial pulses are 2+ on the right side and 2+ on the left side.      Heart sounds: Normal heart sounds. Pulmonary:      Effort: Pulmonary effort is normal. No respiratory distress.      Breath sounds: Normal breath sounds.   Musculoskeletal:      Cervical back: Normal range of motion and neck supple.   Skin:     General: Skin is warm and dry.      Capillary Refill: Capillary refill takes less than 2 seconds.      Coloration: Skin is not cyanotic or mottled.   Neurological:      General: No focal deficit present.      Mental Status: He is alert and oriented to person, place, and time.      Motor: Motor function is intact.      Coordination: Coordination is intact.      Gait:  Gait is intact.      Comments: His grip strength is strong and equal bilaterally.   Psychiatric:         Attention and Perception: Attention and perception normal.         Mood and Affect: Mood and affect normal.         Speech: Speech normal.         Behavior: Behavior normal. Behavior is cooperative.         Thought Content: Thought content normal.         Cognition and Memory: Cognition and memory normal.         Judgment: Judgment normal.             Assessment and Plan:    1. Bilateral carotid artery stenosis  VAS US  DUPLEX SCAN CAROTID BILATERAL      2. Left carotid artery occlusion        3. Essential hypertension        4. Hyperlipidemia, unspecified hyperlipidemia type          He continues to have less than 50% right carotid stenosis and a chronically occluded left internal carotid artery.  He has had no new lateralizing TIA or strokelike symptoms.  I discussed with him his results and recommendations for follow-up.  We discussed that antiplatelet therapy, statin therapy and now his PCSK9 inhibitor regimen all help to reduce atherosclerotic risk factors.    Continue low-dose aspirin , rosuvastatin  and Praluent  as directed.    We will follow-up again in 1 year with a bilateral carotid surveillance ultrasound and office visit at that time.    We did give him information on carotid disease for his visit summary.  He had no additional questions or concerns for me at this time.    Karna CHRISTELLA Cancer, APRN-NP    >  15   Minutes spent on chart review, physical exam and documentation.

## 2024-04-25 ENCOUNTER — Encounter: Admit: 2024-04-25 | Discharge: 2024-04-25 | Payer: MEDICARE

## 2024-05-25 ENCOUNTER — Encounter: Admit: 2024-05-25 | Discharge: 2024-05-25 | Payer: MEDICARE

## 2024-05-26 ENCOUNTER — Encounter: Admit: 2024-05-26 | Discharge: 2024-05-26 | Payer: MEDICARE

## 2024-05-26 MED FILL — PRALUENT PEN 75 MG/ML SC PNIJ: 75 mg/mL | SUBCUTANEOUS | 28 days supply | Qty: 2 | Fill #2 | Status: AC

## 2024-05-31 ENCOUNTER — Encounter: Admit: 2024-05-31 | Discharge: 2024-05-31 | Payer: MEDICARE

## 2024-06-20 ENCOUNTER — Encounter: Admit: 2024-06-20 | Discharge: 2024-06-20 | Payer: MEDICARE
